# Patient Record
Sex: Male | Born: 1960 | Race: White | Hispanic: No | Marital: Married | Smoking: Never smoker
Health system: Southern US, Community
[De-identification: ages and names within clinical notes are randomized; demographics above are authoritative.]

## PROBLEM LIST (undated history)

## (undated) DIAGNOSIS — C439 Malignant melanoma of skin, unspecified: Secondary | ICD-10-CM

## (undated) HISTORY — DX: Malignant melanoma of skin, unspecified: C43.9

## (undated) HISTORY — PX: ACHILLES TENDON REPAIR: SUR1153

## (undated) HISTORY — PX: SMALL INTESTINE SURGERY: SHX150

---

## 2012-02-23 ENCOUNTER — Emergency Department
Admission: EM | Admit: 2012-02-23 | Discharge: 2012-02-23 | Disposition: A | Discharge status: Home / Self Care | Payer: PRIVATE HEALTH INSURANCE | Source: Home / Self Care | Attending: Family Medicine | Admitting: Family Medicine

## 2012-02-23 DIAGNOSIS — J029 Acute pharyngitis, unspecified: Secondary | ICD-10-CM

## 2012-02-23 DIAGNOSIS — J209 Acute bronchitis, unspecified: Secondary | ICD-10-CM

## 2012-02-23 LAB — POCT RAPID STREP A (OFFICE): Rapid Strep A Screen: NEGATIVE

## 2012-02-23 MED ORDER — GUAIFENESIN-CODEINE 100-10 MG/5ML PO SYRP: 10.0000 mL | ORAL_SOLUTION | Freq: Every day | ORAL | Status: AC

## 2012-02-23 MED ORDER — CLARITHROMYCIN 500 MG PO TABS: 500.0000 mg | ORAL_TABLET | Freq: Two times a day (BID) | ORAL | Status: AC

## 2012-02-23 NOTE — ED Provider Notes (Signed)
History     CSN: [id number]  Arrival date & time 02/23/12  1936   First MD Initiated Contact with Patient 02/23/12 2046      Chief Complaint  Patient presents with  . Sore Throat    1 day  . Cough    2 days      HPI Comments: Patient complains of 2 day history of mild sore throat and a non-productive cough.  He has had no nasal congestion.  Complains of fatigue and initial myalgias.  Cough is now worse at night and generally non-productive during the day.  There has been no pleuritic pain, shortness of breath, or wheezes.  He had fever yesterday.  He does not recall his last tetanus shot  The history is provided by the patient.    History reviewed. No pertinent past medical history.  Past Surgical History  Procedure Date  . Achilles tendon repair     History reviewed. No pertinent family history.  History  Substance Use Topics  . Smoking status: Never Smoker   . Smokeless tobacco: Never Used  . Alcohol Use: 0.6 oz/week    1 Cans of beer per week      Review of Systems + sore throat + cough No pleuritic pain No wheezing No nasal congestion No post-nasal drainage No sinus pain/pressure No itchy/red eyes No earache No hemoptysis No SOB + fever, + chills No nausea No vomiting No abdominal pain No diarrhea No urinary symptoms No skin rashes + fatigue + myalgias No headache Used OTC meds without relief  Allergies  Review of patient's allergies indicates no known allergies.  Home Medications   Current Outpatient Rx  Name Route Sig Dispense Refill  . CLARITHROMYCIN 500 MG PO TABS Oral Take 1 tablet (500 mg total) by mouth 2 (two) times daily. 14 tablet 0  . GUAIFENESIN-CODEINE 100-10 MG/5ML PO SYRP Oral Take 10 mLs by mouth at bedtime. for cough 120 mL 0    BP 124/84  Pulse 101  Temp(Src) 99.6 F (37.6 C) (Oral)  Resp 18  Ht 5\' 10"  (1.778 m)  Wt 198 lb (89.812 kg)  BMI 28.41 kg/m2  SpO2 94%  Physical Exam Nursing notes and Vital Signs  reviewed. Appearance:  Patient appears healthy, stated age, and in no acute distress.  Patient is overweight (BMI 28.5) Eyes:  Pupils are equal, round, and reactive to light and accomodation.  Extraocular movement is intact.  Conjunctivae are not inflamed  Ears:  Canals normal.  Tympanic membranes normal.  Nose:  Mildly congested turbinates.  No sinus tenderness.     Pharynx:  Normal Neck:  Supple.  Slightly tender shotty posterior nodes are palpated bilaterally  Lungs:  Clear to auscultation.  Breath sounds are equal.  Heart:  Regular rate and rhythm without murmurs, rubs, or gallops.  Abdomen:  Nontender without masses or hepatosplenomegaly.  Bowel sounds are present.  No CVA or flank tenderness.  Extremities:  No edema.  No calf tenderness Skin:  No rash present.   ED Course  Procedures  none   Labs Reviewed  POCT RAPID STREP A (OFFICE) - Normal      1. Acute bronchitis       MDM  ? Pertussis Begin Biaxin.  Rx for Cherratussin at bedtime. Take plain Mucinex (guaifenesin) twice daily for cough and congestion.  May add Sudafed if sinus congestion increases.  Increase fluid intake, rest. Stop all antihistamines for now, and other non-prescription cough/cold preparations. Recommend a Tdap when well.  Follow-up with family doctor if not improving 7 to 10 days.         Lattie Haw, MD 02/25/12 1149

## 2012-02-23 NOTE — ED Notes (Addendum)
Patient complains of dry cough and sore throat for 24-48 hours. Two weeks ago he had fever and joint pain and saw his primary care doctor and was treated with tamiflu for 5 days. He and his wife went on a cruise to the [place] a week ago. Denies chills or sweats within the last week.

## 2012-02-23 NOTE — Discharge Instructions (Signed)
Take plain Mucinex (guaifenesin) twice daily for cough and congestion.  May add Sudafed if sinus congestion increases.  Increase fluid intake, rest. Stop all antihistamines for now, and other non-prescription cough/cold preparations. Recommend a Tdap when well.  Follow-up with family doctor if not improving 7 to 10 days.

## 2014-12-25 ENCOUNTER — Emergency Department
Admission: EM | Admit: 2014-12-25 | Discharge: 2014-12-25 | Disposition: A | Discharge status: Home / Self Care | Payer: Commercial Managed Care - PPO | Source: Home / Self Care | Attending: Emergency Medicine | Admitting: Emergency Medicine

## 2014-12-25 ENCOUNTER — Encounter: Payer: Self-pay | Admitting: *Deleted

## 2014-12-25 ENCOUNTER — Emergency Department (INDEPENDENT_AMBULATORY_CARE_PROVIDER_SITE_OTHER): Payer: Commercial Managed Care - PPO

## 2014-12-25 DIAGNOSIS — M2550 Pain in unspecified joint: Secondary | ICD-10-CM

## 2014-12-25 DIAGNOSIS — R05 Cough: Secondary | ICD-10-CM | POA: Diagnosis not present

## 2014-12-25 DIAGNOSIS — J189 Pneumonia, unspecified organism: Secondary | ICD-10-CM

## 2014-12-25 DIAGNOSIS — R059 Cough, unspecified: Secondary | ICD-10-CM

## 2014-12-25 DIAGNOSIS — R509 Fever, unspecified: Secondary | ICD-10-CM | POA: Diagnosis not present

## 2014-12-25 DIAGNOSIS — R918 Other nonspecific abnormal finding of lung field: Secondary | ICD-10-CM | POA: Diagnosis not present

## 2014-12-25 LAB — POCT INFLUENZA A/B
Influenza A, POC: NEGATIVE
Influenza B, POC: NEGATIVE

## 2014-12-25 LAB — POCT CBC W AUTO DIFF (K'VILLE URGENT CARE)

## 2014-12-25 MED ORDER — ONDANSETRON 4 MG PO TBDP: 4.0000 mg | ORAL_TABLET | Freq: Three times a day (TID) | ORAL | Status: DC | PRN

## 2014-12-25 MED ORDER — ACETAMINOPHEN 325 MG PO TABS
650.0000 mg | ORAL_TABLET | Freq: Once | ORAL | Status: AC
  Administered 2014-12-25: 650 mg via ORAL

## 2014-12-25 MED ORDER — ONDANSETRON 4 MG PO TBDP
4.0000 mg | ORAL_TABLET | ORAL | Status: AC
  Administered 2014-12-25: 4 mg via ORAL

## 2014-12-25 MED ORDER — CEFTRIAXONE SODIUM 1 G IJ SOLR
1.0000 g | INTRAMUSCULAR | Status: AC
  Administered 2014-12-25: 1 g via INTRAMUSCULAR

## 2014-12-25 MED ORDER — DOXYCYCLINE HYCLATE 100 MG PO CAPS: 100.0000 mg | ORAL_CAPSULE | Freq: Two times a day (BID) | ORAL | Status: DC

## 2014-12-25 MED ORDER — OSELTAMIVIR PHOSPHATE 75 MG PO CAPS: ORAL_CAPSULE | ORAL | Status: DC

## 2014-12-25 NOTE — ED Notes (Signed)
Flay c/o body aches, dizziness, nausea and cough and congestion x yesterday AM. Tylenol taken @ 11am today. Rec'd flu vac this season.

## 2014-12-25 NOTE — ED Provider Notes (Signed)
CSN: [id number]     Arrival date & time 12/25/14  1244 History   First MD Initiated Contact with Patient 12/25/14 1305     Chief Complaint  Patient presents with  . Generalized Body Aches   HPI Tommy Strickland c/o body aches, dizziness, nausea and cough and congestion x yesterday AM. Tylenol taken @ 11am today. Rec'd flu vac this season. Fever to 102, chills, sweats, myalgias and diffuse arthralgias. Moderate nonfocal headache. Complains of mild congestion in sinuses. No discolored rhinorrhea. Has hoarseness. Progressively worse cough, occasionally productive of discolored sputum. Vague feeling of shortness of breath with possible wheezing at times, but he is not certain if he is wheezing. Severe fatigue. Mild nausea without vomiting. No other GI symptoms. He has decreased appetite but is tolerating by mouth liquids and solids. No eye symptoms. No conjunctivitis symptoms. Denies urinary symptoms. No rash or history of tick or insect bite. He was in [place] at a convention last week and felt well then. He flew back to [place] 2 days ago and felt well until yesterday, acute onset of fever or chills and myalgias and arthralgias. Some others of his coworkers from [place] had febrile illnesses. He denies recent foreign travel. Tylenol temporarily helps his headache and fever and myalgias. Denies syncope or focal neurologic symptoms. Nonsmoker. History reviewed. No pertinent past medical history. Past Surgical History  Procedure Laterality Date  . Achilles tendon repair     History reviewed. No pertinent family history. History  Substance Use Topics  . Smoking status: Never Smoker   . Smokeless tobacco: Never Used  . Alcohol Use: 0.6 oz/week    1 Cans of beer per week    Review of Systems  All other systems reviewed and are negative.   Allergies  Review of patient's allergies indicates no known allergies.  Home Medications   Prior to Admission medications   Medication Sig  Start Date End Date Taking? Authorizing Provider  doxycycline (VIBRAMYCIN) 100 MG capsule Take 1 capsule (100 mg total) by mouth 2 (two) times daily. 12/25/14   Jacqulyn Cane, MD  ondansetron (ZOFRAN-ODT) 4 MG disintegrating tablet Take 1 tablet (4 mg total) by mouth every 8 (eight) hours as needed for nausea. 12/25/14   Jacqulyn Cane, MD  oseltamivir (TAMIFLU) 75 MG capsule Starting today, take 1 capsule by mouth twice a day for 5 days. 12/25/14   Jacqulyn Cane, MD   BP 134/83 mmHg  Pulse 110  Temp(Src) 99.7 F (37.6 C) (Oral)  Resp 18  Wt 284 lb (128.822 kg)  SpO2 95% Physical Exam  Constitutional: He appears well-developed and well-nourished. He appears ill (very fatigued, but no cardiorespiratory distress). No distress.  Appears mildly toxic. He is alert, cooperative. Mentation and speech normal  HENT:  Head: Normocephalic and atraumatic.  Right Ear: Tympanic membrane and external ear normal.  Left Ear: Tympanic membrane and external ear normal.  Nose: Mucosal edema (Mild) and rhinorrhea (Minimal) present. Right sinus exhibits no maxillary sinus tenderness and no frontal sinus tenderness. Left sinus exhibits no maxillary sinus tenderness and no frontal sinus tenderness.  Mouth/Throat: Mucous membranes are normal. Posterior oropharyngeal erythema (mild redness ) present. No oropharyngeal exudate.  Eyes: Conjunctivae and EOM are normal. Pupils are equal, round, and reactive to light. Right eye exhibits no discharge and no exudate. Left eye exhibits no discharge and no exudate. Right conjunctiva is not injected. Left conjunctiva is not injected. No scleral icterus.  Neck: Normal range of motion. Neck supple. No JVD present.  No rigidity. Normal range of motion present. No Brudzinski's sign and no Kernig's sign noted.  Cardiovascular: Normal rate, regular rhythm and normal heart sounds.   Pulmonary/Chest: No stridor. No respiratory distress. He has no wheezes. He has rales (mild bibasilar).   Abdominal: Soft. There is no tenderness.  Musculoskeletal: He exhibits no edema.  Lymphadenopathy:    He has cervical adenopathy (mild shoddy anterior cervical nodes).  Neurological: He is alert. He is not disoriented. No cranial nerve deficit or sensory deficit. Coordination normal.  Gait is normal, although it's difficult for him to walk without assistance because of generalized fatigue  Skin: Skin is warm and intact. No rash noted. He is diaphoretic.  Psychiatric: He has a normal mood and affect.  Nursing note and vitals reviewed.   ED Course  Procedures (including critical care time) Labs Review Labs Reviewed  B. BURGDORFI ANTIBODIES  ROCKY MTN SPOTTED FVR ABS PNL(IGG+IGM)  POCT INFLUENZA A/B  POCT CBC W AUTO DIFF ([place])   Results for orders placed or performed during the hospital encounter of 12/25/14  POCT Influenza A/B  Result Value Ref Range   Influenza A, POC Negative    Influenza B, POC Negative    CBC: WBC 6.0, 79% granulocytes.   Hemoglobin 14.3, platelets normal 153,000   Imaging Review Dg Chest 2 View  12/25/2014   CLINICAL DATA:  54 year old male with fever dizziness body ache headache and nausea. Febrile illness. Initial encounter.  EXAM: CHEST  2 VIEW  COMPARISON:  None.  FINDINGS: Low lung volumes with platelike bibasilar opacity. Cardiac size at the upper limits of normal to mildly enlarged. Other mediastinal contours are within normal limits. Visualized tracheal air column is within normal limits. No pneumothorax or pulmonary edema. No pleural effusion. No acute osseous abnormality identified.  IMPRESSION: Low lung volumes with platelike bibasilar opacity that most resembles atelectasis.   Electronically Signed   By: Genevie Ann M.D.   On: 12/25/2014 14:18     MDM   1. CAP (community acquired pneumonia)   2. Cough   3. Febrile illness, acute   4. Acute joint pain    Clinically, I feel that he can be treated as an outpatient but precautions  discussed with patient and wife. We'll treat with doxycycline to cover community-acquired pneumonia as well as atypical causes, and doxycycline would cover RMSF or Lyme's disease.--Send off our MSF and Lyme's disease tests. They declined other testing at this time. Clinically, Zika is lower in the differential, as he does not have conjunctivitis and his arthralgias and myalgias are diffuse, not just in hands and feet. Rapid flu test was negative, but I'm concerned that he might have influenza as a primary or secondary diagnosis. They declined any reference lab viral testing at this time. Treatment options discussed, as well as risks, benefits, alternatives. They voiced understanding and agreement with the following plans: Rocephin 1 g IM stat Discharge Medication List as of 12/25/2014  2:46 PM    START taking these medications   Details  doxycycline (VIBRAMYCIN) 100 MG capsule Take 1 capsule (100 mg total) by mouth 2 (two) times daily., Starting 12/25/2014, Until Discontinued, Print    ondansetron (ZOFRAN-ODT) 4 MG disintegrating tablet Take 1 tablet (4 mg total) by mouth every 8 (eight) hours as needed for nausea., Starting 12/25/2014, Until Discontinued, Print    oseltamivir (TAMIFLU) 75 MG capsule Starting today, take 1 capsule by mouth twice a day for 5 days., Print  push clear liquids and advance to bland diet as tolerated. Follow-up with your primary care doctor in 1-2 days , or sooner if symptoms become worse. Precautions discussed. Red flags discussed.---ER if any red flag. Questions invited and answered. Patient and wife voiced understanding and agreement.       Jacqulyn Cane, MD 12/25/14 [phone]

## 2014-12-25 NOTE — Discharge Instructions (Signed)
Pneumonia Pneumonia is an infection of the lungs.  CAUSES Pneumonia may be caused by bacteria or a virus. Usually, these infections are caused by breathing infectious particles into the lungs (respiratory tract). SIGNS AND SYMPTOMS   Cough.  Fever.  Chest pain.  Increased rate of breathing.  Wheezing.  Mucus production. DIAGNOSIS  If you have the common symptoms of pneumonia, your health care provider will typically confirm the diagnosis with a chest X-ray. The X-ray will show an abnormality in the lung (pulmonary infiltrate) if you have pneumonia. Other tests of your blood, urine, or sputum may be done to find the specific cause of your pneumonia. Your health care provider may also do tests (blood gases or pulse oximetry) to see how well your lungs are working. TREATMENT  Some forms of pneumonia may be spread to other people when you cough or sneeze. You may be asked to wear a mask before and during your exam. Pneumonia that is caused by bacteria is treated with antibiotic medicine. Pneumonia that is caused by the influenza virus may be treated with an antiviral medicine. Most other viral infections must run their course. These infections will not respond to antibiotics.  HOME CARE INSTRUCTIONS   Cough suppressants may be used if you are losing too much rest. However, coughing protects you by clearing your lungs. You should avoid using cough suppressants if you can.  Your health care provider may have prescribed medicine if he or she thinks your pneumonia is caused by bacteria or influenza. Finish your medicine even if you start to feel better.  Your health care provider may also prescribe an expectorant. This loosens the mucus to be coughed up.  Take medicines only as directed by your health care provider.  Do not smoke. Smoking is a common cause of bronchitis and can contribute to pneumonia. If you are a smoker and continue to smoke, your cough may last several weeks after your  pneumonia has cleared.  A cold steam vaporizer or humidifier in your room or home may help loosen mucus.  Coughing is often worse at night. Sleeping in a semi-upright position in a recliner or using a couple pillows under your head will help with this.  Get rest as you feel it is needed. Your body will usually let you know when you need to rest. PREVENTION A pneumococcal shot (vaccine) is available to prevent a common bacterial cause of pneumonia. This is usually suggested for:  People over 65 years old.  Patients on chemotherapy.  People with chronic lung problems, such as bronchitis or emphysema.  People with immune system problems. If you are over 65 or have a high risk condition, you may receive the pneumococcal vaccine if you have not received it before. In some countries, a routine influenza vaccine is also recommended. This vaccine can help prevent some cases of pneumonia.You may be offered the influenza vaccine as part of your care. If you smoke, it is time to quit. You may receive instructions on how to stop smoking. Your health care provider can provide medicines and counseling to help you quit. SEEK MEDICAL CARE IF: You have a fever. SEEK IMMEDIATE MEDICAL CARE IF:   Your illness becomes worse. This is especially true if you are elderly or weakened from any other disease.  You cannot control your cough with suppressants and are losing sleep.  You begin coughing up blood.  You develop pain which is getting worse or is uncontrolled with medicines.  Any of the symptoms   which initially brought you in for treatment are getting worse rather than better.  You develop shortness of breath or chest pain. MAKE SURE YOU:   Understand these instructions.  Will watch your condition.  Will get help right away if you are not doing well or get worse. Document Released: 11/02/2005 Document Revised: 03/19/2014 Document Reviewed: 01/22/2011 [facility] Patient Information 2015  [place], [place]. This information is not intended to replace advice given to you by your health care provider. Make sure you discuss any questions you have with your health care provider.   Influenza Influenza ("the flu") is a viral infection of the respiratory tract. It occurs more often in winter months because people spend more time in close contact with one another. Influenza can make you feel very sick. Influenza easily spreads from person to person (contagious). CAUSES  Influenza is caused by a virus that infects the respiratory tract. You can catch the virus by breathing in droplets from an infected person's cough or sneeze. You can also catch the virus by touching something that was recently contaminated with the virus and then touching your mouth, nose, or eyes. RISKS AND COMPLICATIONS You may be at risk for a more severe case of influenza if you smoke cigarettes, have diabetes, have chronic heart disease (such as heart failure) or lung disease (such as asthma), or if you have a weakened immune system. Elderly people and pregnant women are also at risk for more serious infections. The most common problem of influenza is a lung infection (pneumonia). Sometimes, this problem can require emergency medical care and may be life threatening. SIGNS AND SYMPTOMS  Symptoms typically last 4 to 10 days and may include:  Fever.  Chills.  Headache, body aches, and muscle aches.  Sore throat.  Chest discomfort and cough.  Poor appetite.  Weakness or feeling tired.  Dizziness.  Nausea or vomiting. DIAGNOSIS  Diagnosis of influenza is often made based on your history and a physical exam. A nose or throat swab test can be done to confirm the diagnosis. TREATMENT  In mild cases, influenza goes away on its own. Treatment is directed at relieving symptoms. For more severe cases, your health care provider may prescribe antiviral medicines to shorten the sickness. Antibiotic medicines are not  effective because the infection is caused by a virus, not by bacteria. HOME CARE INSTRUCTIONS  Take medicines only as directed by your health care provider.  Use a cool mist humidifier to make breathing easier.  Get plenty of rest until your temperature returns to normal. This usually takes 3 to 4 days.  Drink enough fluid to keep your urine clear or pale yellow.  Cover yourmouth and nosewhen coughing or sneezing,and wash your handswellto prevent thevirusfrom spreading.  Stay homefromwork orschool untilthe fever is gonefor at least 21full day. PREVENTION  An annual influenza vaccination (flu shot) is the best way to avoid getting influenza. An annual flu shot is now routinely recommended for all adults in the [place] IF:  You experiencechest pain, yourcough worsens,or you producemore mucus.  Youhave nausea,vomiting, ordiarrhea.  Your fever returns or gets worse. SEEK IMMEDIATE MEDICAL CARE IF:  You havetrouble breathing, you become short of breath,or your skin ornails becomebluish.  You have severe painor stiffnessin the neck.  You develop a sudden headache, or pain in the face or ear.  You have nausea or vomiting that you cannot control. MAKE SURE YOU:   Understand these instructions.  Will watch your condition.  Will get  help right away if you are not doing well or get worse. Document Released: 10/30/2000 Document Revised: 03/19/2014 Document Reviewed: 02/01/2012 [facility] Patient Information 2015 [place], [place]. This information is not intended to replace advice given to you by your health care provider. Make sure you discuss any questions you have with your health care provider.   The doxycycline antibiotic prescribed will cover most arterial germs that cause pneumonia. Doxycycline also would cover Lyme's disease or [facility] spotted fever (those tests are pending) Also, we are prescribing Tamiflu for influenza-like  symptoms.  For pain or fever, Tylenol alternating with ibuprofen every 4 hours

## 2014-12-26 ENCOUNTER — Telehealth: Payer: Self-pay | Admitting: *Deleted

## 2014-12-27 LAB — B. BURGDORFI ANTIBODIES: B burgdorferi Ab IgG+IgM: 0.23 {ISR}

## 2014-12-28 LAB — ROCKY MTN SPOTTED FVR ABS PNL(IGG+IGM)
RMSF IgG: 1.98 IV — ABNORMAL HIGH
RMSF IgM: 0.12 IV

## 2015-10-22 ENCOUNTER — Encounter: Payer: Self-pay | Admitting: *Deleted

## 2015-10-22 ENCOUNTER — Emergency Department
Admission: EM | Admit: 2015-10-22 | Discharge: 2015-10-22 | Disposition: A | Discharge status: Home / Self Care | Payer: Commercial Managed Care - PPO | Source: Home / Self Care | Attending: Emergency Medicine | Admitting: Emergency Medicine

## 2015-10-22 DIAGNOSIS — S39012A Strain of muscle, fascia and tendon of lower back, initial encounter: Secondary | ICD-10-CM | POA: Diagnosis not present

## 2015-10-22 DIAGNOSIS — M5441 Lumbago with sciatica, right side: Secondary | ICD-10-CM

## 2015-10-22 MED ORDER — PREDNISONE 10 MG (21) PO TBPK: ORAL_TABLET | ORAL | Status: DC

## 2015-10-22 MED ORDER — HYDROCODONE-ACETAMINOPHEN 5-325 MG PO TABS: 1.0000 | ORAL_TABLET | ORAL | Status: DC | PRN

## 2015-10-22 NOTE — Discharge Instructions (Signed)
Low Back Strain With Rehab A strain is an injury in which a tendon or muscle is torn. The muscles and tendons of the lower back are vulnerable to strains. However, these muscles and tendons are very strong and require a great force to be injured. Strains are classified into three categories. Grade 1 strains cause pain, but the tendon is not lengthened. Grade 2 strains include a lengthened ligament, due to the ligament being stretched or partially ruptured. With grade 2 strains there is still function, although the function may be decreased. Grade 3 strains involve a complete tear of the tendon or muscle, and function is usually impaired. SYMPTOMS   Pain in the lower back.  Pain that affects one side more than the other.  Pain that gets worse with movement and may be felt in the hip, buttocks, or back of the thigh.  Muscle spasms of the muscles in the back.  Swelling along the muscles of the back.  Loss of strength of the back muscles.  Crackling sound (crepitation) when the muscles are touched. CAUSES  Lower back strains occur when a force is placed on the muscles or tendons that is greater than they can handle. Common causes of injury include:  Prolonged overuse of the muscle-tendon units in the lower back, usually from incorrect posture.  A single violent injury or force applied to the back. RISK INCREASES WITH:  Sports that involve twisting forces on the spine or a lot of bending at the waist (football, rugby, weightlifting, bowling, golf, tennis, speed skating, racquetball, swimming, running, gymnastics, diving).  Poor strength and flexibility.  Failure to warm up properly before activity.  Family history of lower back pain or disk disorders.  Previous back injury or surgery (especially fusion).  Poor posture with lifting, especially heavy objects.  Prolonged sitting, especially with poor posture. PREVENTION   Learn and use proper posture when sitting or lifting (maintain  proper posture when sitting, lift using the knees and legs, not at the waist).  Warm up and stretch properly before activity.  Allow for adequate recovery between workouts.  Maintain physical fitness:  Strength, flexibility, and endurance.  Cardiovascular fitness. PROGNOSIS  If treated properly, lower back strains usually heal within 6 weeks. RELATED COMPLICATIONS   Recurring symptoms, resulting in a chronic problem.  Chronic inflammation, scarring, and partial muscle-tendon tear.  Delayed healing or resolution of symptoms.  Prolonged disability. TREATMENT  Start prescription for prednisone, 6 day Dosepak, to reduce pain and inflammation. Prescription for hydrocodone if needed for severe pain. Continue cyclobenzaprine every 8 hours as needed for muscle relaxant. Also, continue Aleve, take 2 every 8 hours with food for pain. (This is nondrowsy). Follow-up with your doctor or orthopedist if no better in one week, sooner if worse or any numbness or weakness in leg. Treatment first involves the use of ice and medicine, to reduce pain and inflammation. The use of strengthening and stretching exercises may help reduce pain with activity. These exercises may be performed at home or with a therapist. Severe injuries may require referral to a therapist for further evaluation and treatment, such as ultrasound. Your caregiver may advise that you wear a back brace or corset, to help reduce pain and discomfort. Often, prolonged bed rest results in greater harm then benefit. Corticosteroid injections may be recommended. However, these should be reserved for the most serious cases. It is important to avoid using your back when lifting objects. At night, sleep on your back on a firm mattress with  a pillow placed under your knees. If non-surgical treatment is unsuccessful, surgery may be needed.  MEDICATION   If pain medicine is needed, nonsteroidal anti-inflammatory medicines (aspirin and ibuprofen),  or other minor pain relievers (acetaminophen), are often advised.  Do not take pain medicine for 7 days before surgery.  Prescription pain relievers may be given, if your caregiver thinks they are needed. Use only as directed and only as much as you need.  Ointments applied to the skin may be helpful.  Corticosteroid injections may be given by your caregiver. These injections should be reserved for the most serious cases, because they may only be given a certain number of times. HEAT AND COLD  Cold treatment (icing) should be applied for 10 to 15 minutes every 2 to 3 hours for inflammation and pain, and immediately after activity that aggravates your symptoms. Use ice packs or an ice massage.  Heat treatment may be used before performing stretching and strengthening activities prescribed by your caregiver, physical therapist, or athletic trainer. Use a heat pack or a warm water soak. SEEK MEDICAL CARE IF:   Symptoms get worse or do not improve in 2 to 4 weeks, despite treatment.  You develop numbness, weakness, or loss of bowel or bladder function.  New, unexplained symptoms develop. (Drugs used in treatment may produce side effects.) EXERCISES  RANGE OF MOTION (ROM) AND STRETCHING EXERCISES - Low Back Strain Most people with lower back pain will find that their symptoms get worse with excessive bending forward (flexion) or arching at the lower back (extension). The exercises which will help resolve your symptoms will focus on the opposite motion.  Your physician, physical therapist or athletic trainer will help you determine which exercises will be most helpful to resolve your lower back pain. Do not complete any exercises without first consulting with your caregiver. Discontinue any exercises which make your symptoms worse until you speak to your caregiver.  If you have pain, numbness or tingling which travels down into your buttocks, leg or foot, the goal of the therapy is for these  symptoms to move closer to your back and eventually resolve. Sometimes, these leg symptoms will get better, but your lower back pain may worsen. This is typically an indication of progress in your rehabilitation. Be very alert to any changes in your symptoms and the activities in which you participated in the 24 hours prior to the change. Sharing this information with your caregiver will allow him/her to most efficiently treat your condition.  These exercises may help you when beginning to rehabilitate your injury. Your symptoms may resolve with or without further involvement from your physician, physical therapist or athletic trainer. While completing these exercises, remember:  Restoring tissue flexibility helps normal motion to return to the joints. This allows healthier, less painful movement and activity.  An effective stretch should be held for at least 30 seconds.  A stretch should never be painful. You should only feel a gentle lengthening or release in the stretched tissue. FLEXION RANGE OF MOTION AND STRETCHING EXERCISES: STRETCH - Flexion, Single Knee to Chest   Lie on a firm bed or floor with both legs extended in front of you.  Keeping one leg in contact with the floor, bring your opposite knee to your chest. Hold your leg in place by either grabbing behind your thigh or at your knee.  Pull until you feel a gentle stretch in your lower back. Hold __________ seconds.  Slowly release your grasp and repeat the  exercise with the opposite side. Repeat __________ times. Complete this exercise __________ times per day.  STRETCH - Flexion, Double Knee to Chest   Lie on a firm bed or floor with both legs extended in front of you.  Keeping one leg in contact with the floor, bring your opposite knee to your chest.  Tense your stomach muscles to support your back and then lift your other knee to your chest. Hold your legs in place by either grabbing behind your thighs or at your  knees.  Pull both knees toward your chest until you feel a gentle stretch in your lower back. Hold __________ seconds.  Tense your stomach muscles and slowly return one leg at a time to the floor. Repeat __________ times. Complete this exercise __________ times per day.  STRETCH - Low Trunk Rotation  Lie on a firm bed or floor. Keeping your legs in front of you, bend your knees so they are both pointed toward the ceiling and your feet are flat on the floor.  Extend your arms out to the side. This will stabilize your upper body by keeping your shoulders in contact with the floor.  Gently and slowly drop both knees together to one side until you feel a gentle stretch in your lower back. Hold for __________ seconds.  Tense your stomach muscles to support your lower back as you bring your knees back to the starting position. Repeat the exercise to the other side. Repeat __________ times. Complete this exercise __________ times per day  EXTENSION RANGE OF MOTION AND FLEXIBILITY EXERCISES: STRETCH - Extension, Prone on Elbows   Lie on your stomach on the floor, a bed will be too soft. Place your palms about shoulder width apart and at the height of your head.  Place your elbows under your shoulders. If this is too painful, stack pillows under your chest.  Allow your body to relax so that your hips drop lower and make contact more completely with the floor.  Hold this position for __________ seconds.  Slowly return to lying flat on the floor. Repeat __________ times. Complete this exercise __________ times per day.  RANGE OF MOTION - Extension, Prone Press Ups  Lie on your stomach on the floor, a bed will be too soft. Place your palms about shoulder width apart and at the height of your head.  Keeping your back as relaxed as possible, slowly straighten your elbows while keeping your hips on the floor. You may adjust the placement of your hands to maximize your comfort. As you gain motion,  your hands will come more underneath your shoulders.  Hold this position __________ seconds.  Slowly return to lying flat on the floor. Repeat __________ times. Complete this exercise __________ times per day.  RANGE OF MOTION- Quadruped, Neutral Spine   Assume a hands and knees position on a firm surface. Keep your hands under your shoulders and your knees under your hips. You may place padding under your knees for comfort.  Drop your head and point your tail bone toward the ground below you. This will round out your lower back like an angry cat. Hold this position for __________ seconds.  Slowly lift your head and release your tail bone so that your back sags into a large arch, like an old horse.  Hold this position for __________ seconds.  Repeat this until you feel limber in your lower back.  Now, find your "sweet spot." This will be the most comfortable position somewhere between  the two previous positions. This is your neutral spine. Once you have found this position, tense your stomach muscles to support your lower back.  Hold this position for __________ seconds. Repeat __________ times. Complete this exercise __________ times per day.  STRENGTHENING EXERCISES - Low Back Strain These exercises may help you when beginning to rehabilitate your injury. These exercises should be done near your "sweet spot." This is the neutral, low-back arch, somewhere between fully rounded and fully arched, that is your least painful position. When performed in this safe range of motion, these exercises can be used for people who have either a flexion or extension based injury. These exercises may resolve your symptoms with or without further involvement from your physician, physical therapist or athletic trainer. While completing these exercises, remember:   Muscles can gain both the endurance and the strength needed for everyday activities through controlled exercises.  Complete these exercises as  instructed by your physician, physical therapist or athletic trainer. Increase the resistance and repetitions only as guided.  You may experience muscle soreness or fatigue, but the pain or discomfort you are trying to eliminate should never worsen during these exercises. If this pain does worsen, stop and make certain you are following the directions exactly. If the pain is still present after adjustments, discontinue the exercise until you can discuss the trouble with your caregiver. STRENGTHENING - Deep Abdominals, Pelvic Tilt  Lie on a firm bed or floor. Keeping your legs in front of you, bend your knees so they are both pointed toward the ceiling and your feet are flat on the floor.  Tense your lower abdominal muscles to press your lower back into the floor. This motion will rotate your pelvis so that your tail bone is scooping upwards rather than pointing at your feet or into the floor.  With a gentle tension and even breathing, hold this position for __________ seconds. Repeat __________ times. Complete this exercise __________ times per day.  STRENGTHENING - Abdominals, Crunches   Lie on a firm bed or floor. Keeping your legs in front of you, bend your knees so they are both pointed toward the ceiling and your feet are flat on the floor. Cross your arms over your chest.  Slightly tip your chin down without bending your neck.  Tense your abdominals and slowly lift your trunk high enough to just clear your shoulder blades. Lifting higher can put excessive stress on the lower back and does not further strengthen your abdominal muscles.  Control your return to the starting position. Repeat __________ times. Complete this exercise __________ times per day.  STRENGTHENING - Quadruped, Opposite UE/LE Lift   Assume a hands and knees position on a firm surface. Keep your hands under your shoulders and your knees under your hips. You may place padding under your knees for comfort.  Find your  neutral spine and gently tense your abdominal muscles so that you can maintain this position. Your shoulders and hips should form a rectangle that is parallel with the floor and is not twisted.  Keeping your trunk steady, lift your right hand no higher than your shoulder and then your left leg no higher than your hip. Make sure you are not holding your breath. Hold this position __________ seconds.  Continuing to keep your abdominal muscles tense and your back steady, slowly return to your starting position. Repeat with the opposite arm and leg. Repeat __________ times. Complete this exercise __________ times per day.  STRENGTHENING - Lower Abdominals,  Double Knee Lift  Lie on a firm bed or floor. Keeping your legs in front of you, bend your knees so they are both pointed toward the ceiling and your feet are flat on the floor.  Tense your abdominal muscles to brace your lower back and slowly lift both of your knees until they come over your hips. Be certain not to hold your breath.  Hold __________ seconds. Using your abdominal muscles, return to the starting position in a slow and controlled manner. Repeat __________ times. Complete this exercise __________ times per day.  POSTURE AND BODY MECHANICS CONSIDERATIONS - Low Back Strain Keeping correct posture when sitting, standing or completing your activities will reduce the stress put on different body tissues, allowing injured tissues a chance to heal and limiting painful experiences. The following are general guidelines for improved posture. Your physician or physical therapist will provide you with any instructions specific to your needs. While reading these guidelines, remember:  The exercises prescribed by your provider will help you have the flexibility and strength to maintain correct postures.  The correct posture provides the best environment for your joints to work. All of your joints have less wear and tear when properly supported by a  spine with good posture. This means you will experience a healthier, less painful body.  Correct posture must be practiced with all of your activities, especially prolonged sitting and standing. Correct posture is as important when doing repetitive low-stress activities (typing) as it is when doing a single heavy-load activity (lifting). RESTING POSITIONS Consider which positions are most painful for you when choosing a resting position. If you have pain with flexion-based activities (sitting, bending, stooping, squatting), choose a position that allows you to rest in a less flexed posture. You would want to avoid curling into a fetal position on your side. If your pain worsens with extension-based activities (prolonged standing, working overhead), avoid resting in an extended position such as sleeping on your stomach. Most people will find more comfort when they rest with their spine in a more neutral position, neither too rounded nor too arched. Lying on a non-sagging bed on your side with a pillow between your knees, or on your back with a pillow under your knees will often provide some relief. Keep in mind, being in any one position for a prolonged period of time, no matter how correct your posture, can still lead to stiffness. PROPER SITTING POSTURE In order to minimize stress and discomfort on your spine, you must sit with correct posture. Sitting with good posture should be effortless for a healthy body. Returning to good posture is a gradual process. Many people can work toward this most comfortably by using various supports until they have the flexibility and strength to maintain this posture on their own. When sitting with proper posture, your ears will fall over your shoulders and your shoulders will fall over your hips. You should use the back of the chair to support your upper back. Your lower back will be in a neutral position, just slightly arched. You may place a small pillow or folded towel  at the base of your lower back for support.  When working at a desk, create an environment that supports good, upright posture. Without extra support, muscles tire, which leads to excessive strain on joints and other tissues. Keep these recommendations in mind: CHAIR:  A chair should be able to slide under your desk when your back makes contact with the back of the chair. This  allows you to work closely.  The chair's height should allow your eyes to be level with the upper part of your monitor and your hands to be slightly lower than your elbows. BODY POSITION  Your feet should make contact with the floor. If this is not possible, use a foot rest.  Keep your ears over your shoulders. This will reduce stress on your neck and lower back. INCORRECT SITTING POSTURES  If you are feeling tired and unable to assume a healthy sitting posture, do not slouch or slump. This puts excessive strain on your back tissues, causing more damage and pain. Healthier options include:  Using more support, like a lumbar pillow.  Switching tasks to something that requires you to be upright or walking.  Talking a brief walk.  Lying down to rest in a neutral-spine position. PROLONGED STANDING WHILE SLIGHTLY LEANING FORWARD  When completing a task that requires you to lean forward while standing in one place for a long time, place either foot up on a stationary 2-4 inch high object to help maintain the best posture. When both feet are on the ground, the lower back tends to lose its slight inward curve. If this curve flattens (or becomes too large), then the back and your other joints will experience too much stress, tire more quickly, and can cause pain. CORRECT STANDING POSTURES Proper standing posture should be assumed with all daily activities, even if they only take a few moments, like when brushing your teeth. As in sitting, your ears should fall over your shoulders and your shoulders should fall over your hips.  You should keep a slight tension in your abdominal muscles to brace your spine. Your tailbone should point down to the ground, not behind your body, resulting in an over-extended swayback posture.  INCORRECT STANDING POSTURES  Common incorrect standing postures include a forward head, locked knees and/or an excessive swayback. WALKING Walk with an upright posture. Your ears, shoulders and hips should all line-up. PROLONGED ACTIVITY IN A FLEXED POSITION When completing a task that requires you to bend forward at your waist or lean over a low surface, try to find a way to stabilize 3 out of 4 of your limbs. You can place a hand or elbow on your thigh or rest a knee on the surface you are reaching across. This will provide you more stability so that your muscles do not fatigue as quickly. By keeping your knees relaxed, or slightly bent, you will also reduce stress across your lower back. CORRECT LIFTING TECHNIQUES DO :   Assume a wide stance. This will provide you more stability and the opportunity to get as close as possible to the object which you are lifting.  Tense your abdominals to brace your spine. Bend at the knees and hips. Keeping your back locked in a neutral-spine position, lift using your leg muscles. Lift with your legs, keeping your back straight.  Test the weight of unknown objects before attempting to lift them.  Try to keep your elbows locked down at your sides in order get the best strength from your shoulders when carrying an object.  Always ask for help when lifting heavy or awkward objects. INCORRECT LIFTING TECHNIQUES DO NOT:   Lock your knees when lifting, even if it is a small object.  Bend and twist. Pivot at your feet or move your feet when needing to change directions.  Assume that you can safely pick up even a paper clip without proper posture.  Sciatica Sciatica is pain, weakness, numbness, or tingling along your sciatic nerve. The nerve starts in the  lower back and runs down the back of each leg. Nerve damage or certain conditions pinch or put pressure on the sciatic nerve. This causes the pain, weakness, and other discomforts of sciatica. HOME CARE   Only take medicine as told by your doctor.  Apply ice to the affected area for 20 minutes. Do this 3-4 times a day for the first 48-72 hours. Then try heat in the same way.  Exercise, stretch, or do your usual activities if these do not make your pain worse.  Go to physical therapy as told by your doctor.  Keep all doctor visits as told.  Do not wear high heels or shoes that are not supportive.  Get a firm mattress if your mattress is too soft to lessen pain and discomfort. GET HELP RIGHT AWAY IF:   You cannot control when you poop (bowel movement) or pee (urinate).  You have more weakness in your lower back, lower belly (pelvis), butt (buttocks), or legs.  You have redness or puffiness (swelling) of your back.  You have a burning feeling when you pee.  You have pain that gets worse when you lie down.  You have pain that wakes you from your sleep.  Your pain is worse than past pain.  Your pain lasts longer than 4 weeks.  You are suddenly losing weight without reason. MAKE SURE YOU:   Understand these instructions.  Will watch this condition.  Will get help right away if you are not doing well or get worse.   This information is not intended to replace advice given to you by your health care provider. Make sure you discuss any questions you have with your health care provider.   Document Released: 08/11/2008 Document Revised: 07/24/2015 Document Reviewed: 03/13/2012 [employer] Interactive Patient Education [employer].

## 2015-10-22 NOTE — ED Notes (Signed)
Pt c/o RT side low back pain x 3 days after stretching to get the Christmas tree off the top of his car. He is wearing back brace from home and has taken Cyclobenzaprine with some relief. He took Cyclobenzaprine at 0600 and 1130 today and Aleve at 0700.

## 2015-10-22 NOTE — ED Provider Notes (Signed)
CSN: [id number]     Arrival date & time 10/22/15  1638 History   First MD Initiated Contact with Patient 10/22/15 1645     Chief Complaint  Patient presents with  . Back Pain    HPI Pt c/o moderate-severe sharp R sided low back pain. Acute onset 3 days ago, after stretching to get the Christmas tree off the top of his car. He is wearing back brace from home, which has helped somewhat. His PCP called in Cyclobenzaprine, with some relief. He took Cyclobenzaprine at 0600 and 1130 today . He took one Aleve at 0700, and that only minimally helped the pain. The midline lumbar pain spreads to the right paralumbar area and deep into the right buttock but does not go down the leg. Denies weakness, numbness or bowel or bladder dysfunction. He denies chronic back problems, but states that he gets a low back strain about once a year that resolves without sequelae. He has never had imaging of his low back.  History reviewed. No pertinent past medical history. Past Surgical History  Procedure Laterality Date  . Achilles tendon repair     History reviewed. No pertinent family history. Social History  Substance Use Topics  . Smoking status: Never Smoker   . Smokeless tobacco: Never Used  . Alcohol Use: 0.6 oz/week    1 Cans of beer per week    Review of Systems  All other systems reviewed and are negative.   Allergies  Review of patient's allergies indicates no known allergies.  Home Medications   Prior to Admission medications   Medication Sig Start Date End Date Taking? Authorizing Provider  cyclobenzaprine (FLEXERIL) 10 MG tablet Take 10 mg by mouth 3 (three) times daily as needed for muscle spasms.   Yes Historical Provider, MD  HYDROcodone-acetaminophen (NORCO/VICODIN) 5-325 MG tablet Take 1-2 tablets by mouth every 4 (four) hours as needed for severe pain. Take with food. Caution: May cause drowsiness 10/22/15   Jacqulyn Cane, MD  predniSONE (STERAPRED UNI-PAK 21 TAB) 10 MG (21) TBPK  tablet Take as directed for 6 days.--Take 6 on day 1, 5 on day 2, 4 on day 3, then 3 tablets on day 4, then 2 tablets on day 5, then 1 on day 6. 10/22/15   Jacqulyn Cane, MD   Meds Ordered and Administered this Visit  Medications - No data to display  BP 130/79 mmHg  Pulse 84  Temp(Src) 97.8 F (36.6 C) (Oral)  Resp 18  Ht 6' (1.829 m)  Wt 290 lb (131.543 kg)  BMI 39.32 kg/m2  SpO2 96% No data found.   Physical Exam  Constitutional: He is oriented to person, place, and time. He appears well-developed and well-nourished. He is cooperative.  Non-toxic appearance. He appears distressed (Appears uncomfortable from low back pain,he splints himselfto move, get up from sitting to standing, with antalgic gait.).  HENT:  Head: Normocephalic and atraumatic.  Mouth/Throat: Oropharynx is clear and moist.  Eyes: EOM are normal. Pupils are equal, round, and reactive to light. No scleral icterus.  Neck: Neck supple.  Cardiovascular: Regular rhythm and normal heart sounds.   Pulmonary/Chest: Effort normal and breath sounds normal. No respiratory distress. He has no wheezes. He has no rales. He exhibits no tenderness.  Abdominal: Soft. There is no tenderness.  Musculoskeletal:       Right hip: Normal.       Left hip: Normal.       Cervical back: He exhibits no tenderness.  Thoracic back: He exhibits no tenderness.       Lumbar back: He exhibits decreased range of motion, tenderness and spasm (Right paralumbar area). He exhibits no bony tenderness, no swelling, no edema, no deformity, no laceration and normal pulse.  POSITIVE Right straight leg-raise test. Negative Left straight leg-raise test.  Negative Right Saralyn Pilar test. Negative Left Saralyn Pilar test.    Neurological: He is alert and oriented to person, place, and time. He has normal strength. He displays no atrophy, no tremor and normal reflexes. No cranial nerve deficit or sensory deficit. He exhibits normal muscle tone. Gait normal.   Reflex Scores:      Patellar reflexes are 2+ on the right side and 2+ on the left side.      Achilles reflexes are 2+ on the right side and 2+ on the left side. Skin: Skin is warm, dry and intact. No lesion and no rash noted.  Psychiatric: He has a normal mood and affect.  Nursing note and vitals reviewed.   ED Course  Procedures (including critical care time)  Labs Review Labs Reviewed - No data to display  Imaging Review No results found.   MDM   1. Lumbar strain, initial encounter   2. Acute back pain with sciatica, right    Options discussed, as well as risks, benefits, alternatives. I suggested and advised lumbar x-rays, but he declined at this time. Patient voiced understanding and agreement with the following plans: Rest, then gradually increase activity. May continue with back brace that he has, but wean off this over the next few days. Discharge Medication List as of 10/22/2015  5:39 PM    START taking these medications   Details  HYDROcodone-acetaminophen (NORCO/VICODIN) 5-325 MG tablet Take 1-2 tablets by mouth every 4 (four) hours as needed for severe pain. Take with food. Caution: May cause drowsiness, Starting 10/22/2015, Until Discontinued, Print    predniSONE (STERAPRED UNI-PAK 21 TAB) 10 MG (21) TBPK tablet Take as directed for 6 days.--Take 6 on day 1, 5 on day 2, 4 on day 3, then 3 tablets on day 4, then 2 tablets on day 5, then 1 on day 6., Print       Continue cyclobenzaprine for muscle relaxant. May take 2 Aleve every 8 hours as needed for mild-to-moderate pain. See detailed Instructions in AVS, which were given to parent. Verbal instructions also given.   Questions invited and answered. Precautions discussed. Red flags discussed.--Emergency room if any red flag. Follow-up with PCP or orthopedist if no better one week.  He voiced understanding and agreement with plans.    Jacqulyn Cane, MD 10/22/15 [phone]

## 2015-11-20 LAB — BASIC METABOLIC PANEL
BUN: 25 — AB (ref 4–21)
Creatinine: 0.9 (ref 0.6–1.3)
GLUCOSE: 90
Potassium: 4.4 (ref 3.4–5.3)
Sodium: 140 (ref 137–147)

## 2015-11-20 LAB — CBC AND DIFFERENTIAL
HEMATOCRIT: 47 (ref 41–53)
HEMOGLOBIN: 15.4 (ref 13.5–17.5)
Platelets: 8 — AB (ref 150–399)
WBC: 5.4

## 2015-11-20 LAB — LIPID PANEL
CHOLESTEROL: 217 — AB (ref 0–200)
HDL: 49 (ref 35–70)
LDL CALC: 129
Triglycerides: 194 — AB (ref 40–160)

## 2015-11-20 LAB — TSH: TSH: 1.37 (ref 0.41–5.90)

## 2015-11-20 LAB — HEPATIC FUNCTION PANEL
ALT: 29 (ref 10–40)
AST: 24 (ref 14–40)
Alkaline Phosphatase: 53 (ref 25–125)
Bilirubin, Total: 0.9

## 2018-01-03 ENCOUNTER — Encounter: Payer: Self-pay | Admitting: Sports Medicine

## 2018-01-03 ENCOUNTER — Ambulatory Visit: Payer: Commercial Managed Care - PPO | Admitting: Sports Medicine

## 2018-01-03 ENCOUNTER — Ambulatory Visit (INDEPENDENT_AMBULATORY_CARE_PROVIDER_SITE_OTHER): Payer: Commercial Managed Care - PPO

## 2018-01-03 DIAGNOSIS — M51369 Other intervertebral disc degeneration, lumbar region without mention of lumbar back pain or lower extremity pain: Secondary | ICD-10-CM | POA: Insufficient documentation

## 2018-01-03 DIAGNOSIS — M5136 Other intervertebral disc degeneration, lumbar region: Secondary | ICD-10-CM | POA: Diagnosis not present

## 2018-01-03 DIAGNOSIS — M4807 Spinal stenosis, lumbosacral region: Secondary | ICD-10-CM

## 2018-01-03 DIAGNOSIS — R635 Abnormal weight gain: Secondary | ICD-10-CM

## 2018-01-03 MED ORDER — PREDNISONE 50 MG PO TABS: ORAL_TABLET | ORAL | 0 refills | Status: DC

## 2018-01-03 MED ORDER — MELOXICAM 15 MG PO TABS: ORAL_TABLET | ORAL | 3 refills | Status: DC

## 2018-01-03 MED ORDER — PHENTERMINE HCL 37.5 MG PO TABS
ORAL_TABLET | ORAL | 0 refills | Status: DC
Start: 2018-01-03 — End: 2018-01-31

## 2018-01-03 NOTE — Assessment & Plan Note (Signed)
Starting phentermine, exercise prescription given. He will follow-up with Iran Planas, PA-C for further weight loss treatment.

## 2018-01-03 NOTE — Progress Notes (Signed)
Subjective:    I'm seeing this patient as a consultation for: Dr. Starling Manns  CC: Low back pain  HPI: Tommy Strickland is a pleasant 57 year old male, he has had long-standing low back pain.  Sitting, flexion, Valsalva make it worse, radiation to the right buttock but not past the knee, no bowel or bladder dysfunction, saddle numbness, constitutional symptoms.  Obesity: Has worked hard with diet, exercise, has trouble losing weight.  Agreeable to try weight loss medication.  I reviewed the past medical history, family history, social history, surgical history, and allergies today and no changes were needed.  Please see the problem list section below in epic for further details.  Past Medical History: Past Medical History:  Diagnosis Date  . Skin cancer (melanoma) [facility])    Past Surgical History: Past Surgical History:  Procedure Laterality Date  . ACHILLES TENDON REPAIR    . SMALL INTESTINE SURGERY     Social History: Social History   Socioeconomic History  . Marital status: Married    Spouse name: None  . Number of children: None  . Years of education: None  . Highest education level: None  Social Needs  . Financial resource strain: None  . Food insecurity - worry: None  . Food insecurity - inability: None  . Transportation needs - medical: None  . Transportation needs - non-medical: None  Occupational History  . None  Tobacco Use  . Smoking status: Never Smoker  . Smokeless tobacco: Never Used  Substance and Sexual Activity  . Alcohol use: Yes    Alcohol/week: 0.6 oz    Types: 1 Cans of beer per week  . Drug use: No  . Sexual activity: None  Other Topics Concern  . None  Social History Narrative  . None   Family History: No family history on file. Allergies: No Known Allergies Medications: See med rec.  Review of Systems: No headache, visual changes, nausea, vomiting, diarrhea, constipation, dizziness, abdominal pain, skin rash, fevers, chills, night  sweats, weight loss, swollen lymph nodes, body aches, joint swelling, muscle aches, chest pain, shortness of breath, mood changes, visual or auditory hallucinations.   Objective:   General: Well Developed, well nourished, and in no acute distress.  Neuro:  Extra-ocular muscles intact, able to move all 4 extremities, sensation grossly intact.  Deep tendon reflexes tested were normal. Psych: Alert and oriented, mood congruent with affect. ENT:  Ears and nose appear unremarkable.  Hearing grossly normal. Neck: Unremarkable overall appearance, trachea midline.  No visible thyroid enlargement. Eyes: Conjunctivae and lids appear unremarkable.  Pupils equal and round. Skin: Warm and dry, no rashes noted.  Cardiovascular: Pulses palpable, no extremity edema. Back Exam:  Inspection: Unremarkable  Motion: Flexion 45 deg, Extension 45 deg, Side Bending to 45 deg bilaterally,  Rotation to 45 deg bilaterally  SLR laying: Negative  XSLR laying: Negative  Palpable tenderness: None. FABER: negative. Sensory change: Gross sensation intact to all lumbar and sacral dermatomes.  Reflexes: 2+ at both patellar tendons, 2+ at achilles tendons, Babinski's downgoing.  Strength at foot  Plantar-flexion: 5/5 Dorsi-flexion: 5/5 Eversion: 5/5 Inversion: 5/5  Leg strength  Quad: 5/5 Hamstring: 5/5 Hip flexor: 5/5 Hip abductors: 5/5  Gait unremarkable.  X-rays personally reviewed and show moderate to severe degenerative disc disease from L3-S1, with the worst disc space narrowing at L5-S1.  Impression and Recommendations:   This case required medical decision making of moderate complexity.  Abnormal weight gain Starting phentermine, exercise prescription given. He will follow-up  with Iran Planas, PA-C for further weight loss treatment.  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 X-rays, formal PT, prednisone, Mobic. Return to see me in 4  weeks. ___________________________________________ Gwen Her. Dianah Field, M.D., ABFM., CAQSM. Primary Care and [place] Instructor of [place] [employer] [facility] of Medicine

## 2018-01-03 NOTE — Assessment & Plan Note (Signed)
Discogenic axial back pain on the right. X-rays, formal PT, prednisone, Mobic. Return to see me in 4 weeks.

## 2018-01-11 LAB — PSA: PSA: 1.1

## 2018-01-11 LAB — HEPATIC FUNCTION PANEL
ALT: 24 (ref 10–40)
AST: 17 (ref 14–40)
Alkaline Phosphatase: 59 (ref 25–125)
Bilirubin, Total: 0.7

## 2018-01-11 LAB — CBC AND DIFFERENTIAL
HEMATOCRIT: 46 (ref 41–53)
Hemoglobin: 15.7 (ref 13.5–17.5)
Neutrophils Absolute: 3
Platelets: 206 (ref 150–399)
WBC: 6.4

## 2018-01-11 LAB — LIPID PANEL
Cholesterol: 165 (ref 0–200)
HDL: 50 (ref 35–70)
LDL CALC: 77
Triglycerides: 190 — AB (ref 40–160)

## 2018-01-11 LAB — BASIC METABOLIC PANEL
BUN: 19 (ref 4–21)
Creatinine: 1.1 (ref 0.6–1.3)
GLUCOSE: 106
POTASSIUM: 4.2 (ref 3.4–5.3)
Sodium: 145 (ref 137–147)

## 2018-01-11 LAB — HEMOGLOBIN A1C: Hemoglobin A1C: 5.7

## 2018-01-11 LAB — TSH: TSH: 2.35 (ref 0.41–5.90)

## 2018-01-13 ENCOUNTER — Ambulatory Visit: Payer: Commercial Managed Care - PPO | Admitting: Rehabilitative and Restorative Service Providers"

## 2018-01-18 ENCOUNTER — Ambulatory Visit: Payer: Commercial Managed Care - PPO | Admitting: Physician Assistant

## 2018-01-18 ENCOUNTER — Encounter: Payer: Self-pay | Admitting: Physician Assistant

## 2018-01-18 VITALS — BP 122/64 | HR 84 | Ht 72.0 in | Wt 280.0 lb

## 2018-01-18 DIAGNOSIS — E6609 Other obesity due to excess calories: Secondary | ICD-10-CM

## 2018-01-18 DIAGNOSIS — C4491 Basal cell carcinoma of skin, unspecified: Secondary | ICD-10-CM | POA: Diagnosis not present

## 2018-01-18 DIAGNOSIS — Z6838 Body mass index (BMI) 38.0-38.9, adult: Secondary | ICD-10-CM

## 2018-01-18 DIAGNOSIS — R7303 Prediabetes: Secondary | ICD-10-CM | POA: Diagnosis not present

## 2018-01-18 DIAGNOSIS — L570 Actinic keratosis: Secondary | ICD-10-CM | POA: Diagnosis not present

## 2018-01-18 NOTE — Patient Instructions (Signed)
Actinic Keratosis An actinic keratosis is a precancerous growth on the skin. This means that it could develop into skin cancer if it is not treated. About 1% of these growths (actinic keratoses) turn into skin cancer within one year if they are not treated. It is important to have all of these growths evaluated to determine the best treatment approach. What are the causes? This condition is caused by getting too much ultraviolet (UV) radiation from the sun or other UV light sources. What increases the risk? The following factors may make you more likely to develop this condition:  Having light-colored skin and blue eyes.  Having blonde or red hair.  Spending a lot of time in the sun.  Inadequate skin protection when outdoors. This may include: ? Not using sunscreen properly. ? Not covering up skin that is exposed to sunlight.  Aging. The risk of developing an actinic keratosis increases with age.  What are the signs or symptoms? Actinic keratoses look like scaly, rough spots of skin.They can be as small as a pinhead or as big as a quarter. They may itch, hurt, or feel sensitive. In most cases, the growths become red. In some cases, they may be skin-colored, light tan, dark tan, pink, or a combination of any of these colors. There may be a small piece of pink or gray skin (skin tag) growing from the actinic keratosis. In some cases, it may be easier to notice actinic keratoses by feeling them, rather than seeing them. Actinic keratoses appear most often on areas of skin that get a lot of sun exposure, including the scalp, face, ears, lips, upper back, forearms, and the backs of the hands. Sometimes, actinic keratoses disappear, but many reappear a few days to a few weeks later. How is this diagnosed? This condition is usually diagnosed with a physical exam. A tissue sample may be removed from the actinic keratosis and examined under a microscope (biopsy). How is this treated?  Treatment for  this condition may include:  Scraping off the actinic keratosis (curettage).  Freezing the actinic keratosis with liquid nitrogen (cryosurgery). This causes the growth to eventually fall off the skin.  Applying medicated creams or gels to destroy the cells in the growth.  Applying chemicals to the actinic keratosis to make the outer layers of skin peel off (chemical peel).  Photodynamic therapy. In this procedure, medicated cream is applied to the actinic keratosis. This cream increases your skin's sensitivity to light. Then, a strong light is aimed at the actinic keratosis to destroy cells in the growth.  Follow these instructions at home: Skin care  Apply cool, wet cloths (cool compresses) to the affected areas.  Do not scratch your skin.  Check your skin regularly for any growths, especially growths that: ? Start to itch or bleed. ? Change in size, shape, or color. Caring for the treated area  Keep the treated area clean and dry as told by your health care provider.  Do not apply any medicine, cream, or lotion to the treated area unless your health care provider tells you to do that.  Do not pick at blisters or try to break them open. This can cause infection and scarring.  If you have red or irritated skin after treatment, follow instructions from your health care provider about how to take care of the treated area. Make sure you: ? Wash your hands with soap and water before you change your bandage (dressing). If soap and water are not available, use  hand sanitizer. ? Change your dressing as told by your health care provider.  If you have red or irritated skin after treatment, check your treated area every day for signs of infection. Check for: ? Swelling, pain, or more redness. ? Fluid or blood. ? Warmth. ? Pus or a bad smell. General instructions  Take over-the-counter and prescription medicines only as told by your health care provider.  Return to your normal  activities as told by your health care provider. Ask your health care provider what activities are safe for you.  Do not use any tobacco products, such as cigarettes, chewing tobacco, and e-cigarettes. If you need help quitting, ask your health care provider.  Have a skin exam done every year by a health care provider who is a skin conditions specialist (dermatologist).  Keep all follow-up visits as told by your health care provider. This is important. How is this prevented?  Do not get sunburns.  Try to avoid the sun between 10:00 a.m. and 4:00 p.m. This is when the UV light is the strongest.  Use a sunscreen or sunblock with SPF 30 (sun protection factor 30) or greater.  Apply sunscreen before you are exposed to sunlight, and reapply periodically as often as directed by the instructions on the sunscreen container.  Always wear sunglasses that have UV protection, and always wear hats and clothing to protect your skin from sunlight.  When possible, avoid medicines that increase your sensitivity to sunlight. These include: ? Certain antibiotic medicines. ? Certain water pills (diuretics). ? Certain prescription medicines that are used to treat acne (retinoids).  Do not use tanning beds or other indoor tanning devices. Contact a health care provider if:  You notice any changes or new growths on your skin.  You have swelling, pain, or more redness around your treated area.  You have fluid or blood coming from your treated area.  Your treated area feels warm to the touch.  You have pus or a bad smell coming from your treated area.  You have a fever.  You have a blister that becomes large and painful. This information is not intended to replace advice given to you by your health care provider. Make sure you discuss any questions you have with your health care provider. Document Released: 01/29/2009 Document Revised: 07/03/2016 Document Reviewed: 07/13/2015 Elsevier Interactive  Patient Education  2018 Elsevier Inc.  

## 2018-01-18 NOTE — Progress Notes (Addendum)
Subjective:    Patient ID: Tommy Strickland, male    DOB: 11/13/1961, 57 y.o.   MRN: [id number]  HPI Patient is a 57 year old obese male who presents to the clinic to establish care.  He would like me to look at scaly lesion on left temple. Hx of BCC.   .. Active Ambulatory Problems    Diagnosis Date Noted  . Lumbar degenerative disc disease 01/03/2018  . Abnormal weight gain 01/03/2018  . Skin cancer, basal cell 01/18/2018  . Pre-diabetes 01/18/2018   Resolved Ambulatory Problems    Diagnosis Date Noted  . No Resolved Ambulatory Problems   No Additional Past Medical History    Social History   Socioeconomic History  . Marital status: Married    Spouse name: Izora Gala  . Number of children: 1  . Years of education: Not on file  . Highest education level: Not on file  Social Needs  . Financial resource strain: Not on file  . Food insecurity - worry: Not on file  . Food insecurity - inability: Not on file  . Transportation needs - medical: Not on file  . Transportation needs - non-medical: Not on file  Occupational History  . Not on file  Tobacco Use  . Smoking status: Never Smoker  . Smokeless tobacco: Never Used  Substance and Sexual Activity  . Alcohol use: No    Alcohol/week: 0.6 oz    Types: 1 Cans of beer per week    Frequency: Never  . Drug use: No  . Sexual activity: Not on file  Other Topics Concern  . Not on file  Social History Narrative  . Not on file   .Marland Kitchen Family History  Problem Relation Age of Onset  . Stroke Paternal Grandfather     .Marland Kitchen Past Surgical History:  Procedure Laterality Date  . ACHILLES TENDON REPAIR    . SMALL INTESTINE SURGERY        Review of Systems  All other systems reviewed and are negative.      Objective:   Physical Exam  Constitutional: He is oriented to person, place, and time. He appears well-developed and well-nourished.  Obese.   HENT:  Head: Normocephalic and atraumatic.  Neck: Normal range of motion.  Neck supple.  Cardiovascular: Normal rate, regular rhythm and normal heart sounds.  Pulmonary/Chest: Effort normal and breath sounds normal.  Lymphadenopathy:    He has no cervical adenopathy.  Neurological: He is alert and oriented to person, place, and time.  Skin:  Erythematous macule with some fine scales on top. Approximately 1cm by .5cm on left temple.   Psychiatric: He has a normal mood and affect. His behavior is normal.          Assessment & Plan:  Marland KitchenMarland KitchenKree was seen today for establish care and prediabetes.  Diagnoses and all orders for this visit:  Class 2 obesity due to excess calories without serious comorbidity with body mass index (BMI) of 38.0 to 38.9 in adult  Skin cancer, basal cell  Actinic keratoses  Pre-diabetes   Cryotherapy Procedure Note  Pre-operative Diagnosis: Actinic keratosis  Post-operative Diagnosis: Actinic keratosis  Locations: left temple  Indications: precancerous  Procedure Details  History of allergy to iodine: no. Pacemaker? no.  Patient informed of risks (permanent scarring, infection, light or dark discoloration, bleeding, infection, weakness, numbness and recurrence of the lesion) and benefits of the procedure and verbal informed consent obtained.  The areas are treated with liquid nitrogen therapy, frozen until ice ball  extended 2 mm beyond lesion, allowed to thaw, and treated again. The patient tolerated procedure well.  The patient was instructed on post-op care, warned that there may be blister formation, redness and pain. Recommend OTC analgesia as needed for pain.  Condition: Stable  Complications: none.  Plan: 1. Instructed to keep the area dry and covered for 24-48h and clean thereafter. 2. Warning signs of infection were reviewed.   3. Recommended that the patient use OTC acetaminophen as needed for pain.    Pt had recent labs that look really good except for pre-diabetes. Weight loss should help with  this.continue on phentermine. Marland Kitchen.Discussed low carb diet with 1500 calories and 80g of protein.  Exercising at least 150 minutes a week.  [employer] could be a Microbiologist.    Will continue to monitor and recheck in 6 months.    Need records to up date health maintenance. Pt may call with colonoscopy clinic so we can get report.

## 2018-01-20 ENCOUNTER — Encounter: Payer: Self-pay | Admitting: Physician Assistant

## 2018-01-31 ENCOUNTER — Encounter: Payer: Self-pay | Admitting: Sports Medicine

## 2018-01-31 ENCOUNTER — Ambulatory Visit: Payer: Commercial Managed Care - PPO | Admitting: Sports Medicine

## 2018-01-31 DIAGNOSIS — R635 Abnormal weight gain: Secondary | ICD-10-CM | POA: Diagnosis not present

## 2018-01-31 DIAGNOSIS — M5136 Other intervertebral disc degeneration, lumbar region: Secondary | ICD-10-CM | POA: Diagnosis not present

## 2018-01-31 DIAGNOSIS — Z6838 Body mass index (BMI) 38.0-38.9, adult: Secondary | ICD-10-CM

## 2018-01-31 DIAGNOSIS — E6609 Other obesity due to excess calories: Secondary | ICD-10-CM

## 2018-01-31 DIAGNOSIS — M51369 Other intervertebral disc degeneration, lumbar region without mention of lumbar back pain or lower extremity pain: Secondary | ICD-10-CM

## 2018-01-31 MED ORDER — PHENTERMINE HCL 37.5 MG PO TABS: ORAL_TABLET | ORAL | 0 refills | Status: DC

## 2018-01-31 NOTE — Progress Notes (Signed)
  Subjective:    CC: Follow-up  HPI: Obesity: Mild weight loss with the first month on phentermine.  Low back pain: For the most part resolved with therapy, rehab exercises, meloxicam.  Continuing his exercises and losing weight will be the best bet for this.  I reviewed the past medical history, family history, social history, surgical history, and allergies today and no changes were needed.  Please see the problem list section below in epic for further details.  Past Medical History: No past medical history on file. Past Surgical History: Past Surgical History:  Procedure Laterality Date  . ACHILLES TENDON REPAIR    . SMALL INTESTINE SURGERY     Social History: Social History   Socioeconomic History  . Marital status: Married    Spouse name: Izora Gala  . Number of children: 1  . Years of education: None  . Highest education level: None  Social Needs  . Financial resource strain: None  . Food insecurity - worry: None  . Food insecurity - inability: None  . Transportation needs - medical: None  . Transportation needs - non-medical: None  Occupational History  . None  Tobacco Use  . Smoking status: Never Smoker  . Smokeless tobacco: Never Used  Substance and Sexual Activity  . Alcohol use: No    Alcohol/week: 0.6 oz    Types: 1 Cans of beer per week    Frequency: Never  . Drug use: No  . Sexual activity: None  Other Topics Concern  . None  Social History Narrative  . None   Family History: Family History  Problem Relation Age of Onset  . Stroke Paternal Grandfather    Allergies: No Known Allergies Medications: See med rec.  Review of Systems: No fevers, chills, night sweats, weight loss, chest pain, or shortness of breath.   Objective:    General: Well Developed, well nourished, and in no acute distress.  Neuro: Alert and oriented x3, extra-ocular muscles intact, sensation grossly intact.  HEENT: Normocephalic, atraumatic, pupils equal round reactive to  light, neck supple, no masses, no lymphadenopathy, thyroid nonpalpable.  Skin: Warm and dry, no rashes. Cardiac: Regular rate and rhythm, no murmurs rubs or gallops, no lower extremity edema.  Respiratory: Clear to auscultation bilaterally. Not using accessory muscles, speaking in full sentences.  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 Really had does not have any more pain. Continue aggressive weight loss, return to see me as needed.  Class 2 obesity due to excess calories without serious comorbidity with body mass index (BMI) of 38.0 to 38.9 in adult Happy to refill the phentermine, he can do his next week check with his PCP. Entering the second month. ___________________________________________ Gwen Her. Dianah Field, M.D., ABFM., CAQSM. Primary Care and [place] Instructor of [place] of [facility] of Medicine

## 2018-01-31 NOTE — Assessment & Plan Note (Signed)
Happy to refill the phentermine, he can do his next week check with his PCP. Entering the second month.

## 2018-01-31 NOTE — Assessment & Plan Note (Signed)
Discogenic axial back pain on the right, he has responded well to home rehab, Mobic. Really had does not have any more pain. Continue aggressive weight loss, return to see me as needed.

## 2018-02-28 ENCOUNTER — Ambulatory Visit: Payer: Commercial Managed Care - PPO | Admitting: Physician Assistant

## 2018-04-06 ENCOUNTER — Encounter: Payer: Self-pay | Admitting: Physician Assistant

## 2019-01-20 ENCOUNTER — Ambulatory Visit: Payer: Commercial Managed Care - PPO | Admitting: Physician Assistant

## 2019-01-23 ENCOUNTER — Encounter: Payer: Self-pay | Admitting: Physician Assistant

## 2019-01-23 ENCOUNTER — Ambulatory Visit: Payer: Commercial Managed Care - PPO | Admitting: Physician Assistant

## 2019-01-23 VITALS — BP 115/69 | HR 66 | Ht 72.0 in | Wt 260.0 lb

## 2019-01-23 DIAGNOSIS — K635 Polyp of colon: Secondary | ICD-10-CM | POA: Diagnosis not present

## 2019-01-23 DIAGNOSIS — Z1211 Encounter for screening for malignant neoplasm of colon: Secondary | ICD-10-CM

## 2019-01-23 DIAGNOSIS — Z0000 Encounter for general adult medical examination without abnormal findings: Secondary | ICD-10-CM

## 2019-01-23 DIAGNOSIS — Z23 Encounter for immunization: Secondary | ICD-10-CM | POA: Diagnosis not present

## 2019-01-23 NOTE — Progress Notes (Signed)
   Subjective:    Patient ID: Tommy Strickland, male    DOB: [birth year], 58 y.o.   MRN: [id number]  HPI  Pt is a 58 yo male who presents to the clinic to discuss need for colonoscopy.   2013 colonoscopy at [facility]. Hx of colon polyp. Not aware of when he was supposed to follow up.   He gets labs at work. Last drawn feb 2019.   .. Active Ambulatory Problems    Diagnosis Date Noted  . Lumbar degenerative disc disease 01/03/2018  . Abnormal weight gain 01/03/2018  . Skin cancer, basal cell 01/18/2018  . Pre-diabetes 01/18/2018  . Class 2 obesity due to excess calories without serious comorbidity with body mass index (BMI) of 38.0 to 38.9 in adult 01/18/2018  . Actinic keratoses 01/18/2018  . Polyp of colon 01/23/2019   Resolved Ambulatory Problems    Diagnosis Date Noted  . No Resolved Ambulatory Problems   No Additional Past Medical History      Review of Systems See HPI.     Objective:   Physical Exam Vitals signs reviewed.  Constitutional:      Appearance: Normal appearance.  HENT:     Head: Normocephalic and atraumatic.     Nose: Nose normal.  Cardiovascular:     Rate and Rhythm: Normal rate and regular rhythm.  Pulmonary:     Effort: Pulmonary effort is normal.     Breath sounds: Normal breath sounds.  Neurological:     General: No focal deficit present.     Mental Status: He is alert and oriented to person, place, and time.  Psychiatric:        Mood and Affect: Mood normal.        Behavior: Behavior normal.           Assessment & Plan:  Marland KitchenMarland KitchenDiagnoses and all orders for this visit:  Colon cancer screening -     Ambulatory referral to Gastroenterology  Need for shingles vaccine -     Varicella-zoster vaccine IM (Shingrix)  Polyp of colon, unspecified part of colon, unspecified type -     Ambulatory referral to Gastroenterology   Suspect patient needs follow up sooner than 10 years due to polyp. Made referral.   Over 50 discussed  shingrix. Pt agreed. 1st shot given today. Follow up in 2 months.   Will get labs at work. Asked to add hep C screening.

## 2019-01-23 NOTE — Patient Instructions (Addendum)
Need Hep C.   Recombinant Zoster (Shingles) Vaccine, RZV: What You Need to Know 1. Why get vaccinated? Shingles (also called herpes zoster, or just zoster) is a painful skin rash, often with blisters. Shingles is caused by the varicella zoster virus, the same virus that causes chickenpox. After you have chickenpox, the virus stays in your body and can cause shingles later in life. You can't catch shingles from another person. However, a person who has never had chickenpox (or chickenpox vaccine) could get chickenpox from someone with shingles. A shingles rash usually appears on one side of the face or body and heals within 2 to 4 weeks. Its main symptom is pain, which can be severe. Other symptoms can include fever, headache, chills, and upset stomach. Very rarely, a shingles infection can lead to pneumonia, hearing problems, blindness, brain inflammation (encephalitis), or death. For about 1 person in 5, severe pain can continue even long after the rash has cleared up. This long-lasting pain is called post-herpetic neuralgia (PHN). Shingles is far more common in people 66 years of age and older than in younger people, and the risk increases with age. It is also more common in people whose immune system is weakened because of a disease such as cancer, or by drugs such as steroids or chemotherapy. At least 1 million people a year in the [place] get shingles. 2. Shingles vaccine (recombinant) Recombinant shingles vaccine was approved by FDA in 2017 for the prevention of shingles. In clinical trials, it was more than 90% effective in preventing shingles. It can also reduce the likelihood of PHN. Two doses, 2 to 6 months apart, are recommended for adults 17 and older. This vaccine is also recommended for people who have already gotten the live shingles vaccine (Zostavax). There is no live virus in this vaccine. 3. Some people should not get this vaccine Tell your vaccine provider if you:  Have any  severe, life-threatening allergies. A person who has ever had a life-threatening allergic reaction after a dose of recombinant shingles vaccine, or has a severe allergy to any component of this vaccine, may be advised not to be vaccinated. Ask your health care provider if you want information about vaccine components.  Are pregnant or breastfeeding. There is not much information about use of recombinant shingles vaccine in pregnant or nursing women. Your healthcare provider might recommend delaying vaccination.  Are not feeling well. If you have a mild illness, such as a cold, you can probably get the vaccine today. If you are moderately or severely ill, you should probably wait until you recover. Your doctor can advise you. 4. Risks of a vaccine reaction With any medicine, including vaccines, there is a chance of reactions. After recombinant shingles vaccination, a person might experience:  Pain, redness, soreness, or swelling at the site of the injection  Headache, muscle aches, fever, shivering, fatigue In clinical trials, most people got a sore arm with mild or moderate pain after vaccination, and some also had redness and swelling where they got the shot. Some people felt tired, had muscle pain, a headache, shivering, fever, stomach pain, or nausea. About 1 out of 6 people who got recombinant zoster vaccine experienced side effects that prevented them from doing regular activities. Symptoms went away on their own in about 2 to 3 days. Side effects were more common in younger people. You should still get the second dose of recombinant zoster vaccine even if you had one of these reactions after the first dose.  Other things that could happen after this vaccine:  People sometimes faint after medical procedures, including vaccination. Sitting or lying down for about 15 minutes can help prevent fainting and injuries caused by a fall. Tell your provider if you feel dizzy or have vision changes or  ringing in the ears.  Some people get shoulder pain that can be more severe and longer-lasting than routine soreness that can follow injections. This happens very rarely.  Any medication can cause a severe allergic reaction. Such reactions to a vaccine are estimated at about 1 in a million doses, and would happen within a few minutes to a few hours after the vaccination. As with any medicine, there is a very remote chance of a vaccine causing a serious injury or death. The safety of vaccines is always being monitored. For more information, visit: http://www.aguilar.org/ 5. What if there is a serious problem? What should I look for?  Look for anything that concerns you, such as signs of a severe allergic reaction, very high fever, or unusual behavior. Signs of a severe allergic reaction can include hives, swelling of the face and throat, difficulty breathing, a fast heartbeat, dizziness, and weakness. These would usually start a few minutes to a few hours after the vaccination. What should I do?  If you think it is a severe allergic reaction or other emergency that can't wait, call 9-1-1 or get to the nearest hospital. Otherwise, call your health care provider. Afterward, the reaction should be reported to the Vaccine Adverse Event Reporting System (VAERS). Your doctor should file this report, or you can do it yourself through the VAERS website at www.vaers.SamedayNews.es, or by calling 253-742-8288. VAERS does not give medical advice. 6. How can I learn more?  Ask your health care provider. He or she can give you the vaccine package insert or suggest other sources of information.  Call your local or state health department.  Contact the Centers for Disease Control and Prevention (CDC): ? Call 267-181-0125 (1-800-CDC-INFO) or ? Visit CDC's vaccines website at http://hunter.com/ CDC Vaccine Information Statement Recombinant Zoster Vaccine (12/28/2016) This information is not intended to  replace advice given to you by your health care provider. Make sure you discuss any questions you have with your health care provider. Document Released: 01/12/2017 Document Revised: 06/08/2018 Document Reviewed: 06/08/2018 Elsevier Interactive Patient Education  2019 [employer].

## 2019-01-23 NOTE — Progress Notes (Signed)
l °

## 2019-04-24 ENCOUNTER — Encounter: Payer: Self-pay | Admitting: Physician Assistant

## 2019-04-24 LAB — HM COLONOSCOPY

## 2019-05-01 ENCOUNTER — Ambulatory Visit (INDEPENDENT_AMBULATORY_CARE_PROVIDER_SITE_OTHER): Payer: Commercial Managed Care - PPO | Admitting: Physician Assistant

## 2019-05-01 ENCOUNTER — Encounter: Payer: Self-pay | Admitting: Physician Assistant

## 2019-05-01 VITALS — BP 114/70 | HR 70 | Temp 97.5°F | Ht 72.0 in | Wt 260.0 lb

## 2019-05-01 DIAGNOSIS — Z23 Encounter for immunization: Secondary | ICD-10-CM

## 2019-05-01 DIAGNOSIS — R7303 Prediabetes: Secondary | ICD-10-CM

## 2019-05-01 DIAGNOSIS — E6609 Other obesity due to excess calories: Secondary | ICD-10-CM

## 2019-05-01 DIAGNOSIS — Z13220 Encounter for screening for lipoid disorders: Secondary | ICD-10-CM

## 2019-05-01 DIAGNOSIS — Z6835 Body mass index (BMI) 35.0-35.9, adult: Secondary | ICD-10-CM

## 2019-05-01 NOTE — Progress Notes (Signed)
Subjective:    Patient ID: Tommy Strickland, male    DOB: 1961/02/11, 58 y.o.   MRN: [id number]  HPI  Pt is a 59 yo obese male who presents to the clinic for 2nd shingles shot and follow up.   His first shingles vaccine hurt his arm for 5 days and wonders if this is normal. No problems now.   He is doing well. He has lost over 50lbs since July of last year. His starting weight was 314 and now 260. He is doing the keto and trying to be more active. He feels so much better.   His last labs were feb 2019. He was pre-diabetic.   .. Active Ambulatory Problems    Diagnosis Date Noted  . Lumbar degenerative disc disease 01/03/2018  . Abnormal weight gain 01/03/2018  . Skin cancer, basal cell 01/18/2018  . Pre-diabetes 01/18/2018  . Class 2 obesity due to excess calories without serious comorbidity with body mass index (BMI) of 38.0 to 38.9 in adult 01/18/2018  . Actinic keratoses 01/18/2018  . Polyp of colon 01/23/2019   Resolved Ambulatory Problems    Diagnosis Date Noted  . No Resolved Ambulatory Problems   No Additional Past Medical History     Review of Systems  All other systems reviewed and are negative.      Objective:   Physical Exam Vitals signs reviewed.  Constitutional:      Appearance: Normal appearance.  Cardiovascular:     Rate and Rhythm: Normal rate and regular rhythm.     Pulses: Normal pulses.     Heart sounds: Normal heart sounds.  Pulmonary:     Effort: Pulmonary effort is normal.     Breath sounds: Normal breath sounds.  Neurological:     General: No focal deficit present.     Mental Status: He is alert and oriented to person, place, and time.  Psychiatric:        Mood and Affect: Mood normal.        Behavior: Behavior normal.           Assessment & Plan:  Marland KitchenMarland KitchenAjit was seen today for follow-up.  Diagnoses and all orders for this visit:  Class 2 obesity due to excess calories without serious comorbidity with body mass index (BMI) of 35.0  to 35.9 in adult  Need for shingles vaccine -     Varicella-zoster vaccine IM (Shingrix)  Screening for lipid disorders -     Lipid Panel w/reflex Direct LDL  Prediabetes -     COMPLETE METABOLIC PANEL WITH GFR -     Hemoglobin A1c   .Marland Kitchen Depression screen [facility] 2/9 05/01/2019 01/18/2018  Decreased Interest 0 0  Down, Depressed, Hopeless 0 0  PHQ - 2 Score 0 0  Altered sleeping 0 -  Tired, decreased energy 0 -  Change in appetite 0 -  Feeling bad or failure about yourself  0 -  Trouble concentrating 0 -  Moving slowly or fidgety/restless 0 -  Suicidal thoughts 0 -  PHQ-9 Score 0 -  Difficult doing work/chores Not difficult at all -   Continues to lose weight. Keep up the good work. Goal BMI is under 30 for him.  Fasting labs ordered. He is going to check with work and see if they are doing free screenings this year.   Will call to get official colonoscopy report.  Last shingles vaccine given. Discussed IM injection and their can be some discomfort. Use warm compresses and NsAIDs  as needed.

## 2019-05-02 ENCOUNTER — Encounter: Payer: Self-pay | Admitting: Physician Assistant

## 2019-05-05 ENCOUNTER — Encounter: Payer: Self-pay | Admitting: Physician Assistant

## 2019-05-06 LAB — COMPLETE METABOLIC PANEL WITH GFR
AG Ratio: 1.6 (calc) (ref 1.0–2.5)
ALT: 19 U/L (ref 9–46)
AST: 18 U/L (ref 10–35)
Albumin: 4.5 g/dL (ref 3.6–5.1)
Alkaline phosphatase (APISO): 44 U/L (ref 35–144)
BUN: 16 mg/dL (ref 7–25)
CO2: 23 mmol/L (ref 20–32)
Calcium: 9.4 mg/dL (ref 8.6–10.3)
Chloride: 103 mmol/L (ref 98–110)
Creat: 1 mg/dL (ref 0.70–1.33)
GFR, Est African American: 96 mL/min/{1.73_m2} (ref >=60)
GFR, Est Non African American: 83 mL/min/{1.73_m2} (ref >=60)
Globulin: 2.8 g/dL (calc) (ref 1.9–3.7)
Glucose, Bld: 80 mg/dL (ref 65–99)
Potassium: 4.3 mmol/L (ref 3.5–5.3)
Sodium: 140 mmol/L (ref 135–146)
Total Bilirubin: 0.8 mg/dL (ref 0.2–1.2)
Total Protein: 7.3 g/dL (ref 6.1–8.1)

## 2019-05-06 LAB — HEMOGLOBIN A1C
Hgb A1c MFr Bld: 5.3 % of total Hgb (ref <5.7)
Mean Plasma Glucose: 105 (calc)
eAG (mmol/L): 5.8 (calc)

## 2019-05-06 LAB — LIPID PANEL W/REFLEX DIRECT LDL
Cholesterol: 194 mg/dL (ref <200)
HDL: 44 mg/dL (ref >=40)
LDL Cholesterol (Calc): 125 mg/dL (calc) — ABNORMAL HIGH
Non-HDL Cholesterol (Calc): 150 mg/dL (calc) — ABNORMAL HIGH (ref <130)
Total CHOL/HDL Ratio: 4.4 (calc) (ref <5.0)
Triglycerides: 141 mg/dL (ref <150)

## 2019-05-08 ENCOUNTER — Encounter: Payer: Self-pay | Admitting: Physician Assistant

## 2019-05-08 DIAGNOSIS — E785 Hyperlipidemia, unspecified: Secondary | ICD-10-CM | POA: Insufficient documentation

## 2019-05-08 NOTE — Progress Notes (Signed)
Call pt: A!C went down!!! [place] job. 5.3. kidney and liver look great!. TG went down. LDL is 125 but with no risk factors ok level. Would like to see it under 100. Will continue to monitor. 10 year CV risk is 6.3 percent. Under 7.5 ok to not treat with medication. Lab improving.

## 2019-05-25 ENCOUNTER — Encounter: Payer: Self-pay | Admitting: Physician Assistant

## 2019-06-14 ENCOUNTER — Encounter: Payer: Self-pay | Admitting: Physician Assistant

## 2019-06-14 ENCOUNTER — Ambulatory Visit (INDEPENDENT_AMBULATORY_CARE_PROVIDER_SITE_OTHER): Payer: Commercial Managed Care - PPO | Admitting: Physician Assistant

## 2019-06-14 ENCOUNTER — Other Ambulatory Visit: Payer: Self-pay

## 2019-06-14 VITALS — BP 121/66 | HR 62 | Temp 97.8°F | Ht 72.0 in | Wt 265.0 lb

## 2019-06-14 DIAGNOSIS — Z6835 Body mass index (BMI) 35.0-35.9, adult: Secondary | ICD-10-CM | POA: Diagnosis not present

## 2019-06-14 DIAGNOSIS — E6609 Other obesity due to excess calories: Secondary | ICD-10-CM | POA: Diagnosis not present

## 2019-06-14 DIAGNOSIS — E785 Hyperlipidemia, unspecified: Secondary | ICD-10-CM | POA: Diagnosis not present

## 2019-06-14 DIAGNOSIS — Z0000 Encounter for general adult medical examination without abnormal findings: Secondary | ICD-10-CM

## 2019-06-14 NOTE — Patient Instructions (Signed)

## 2019-06-14 NOTE — Progress Notes (Signed)
Subjective:    Patient ID: Tommy Strickland, male    DOB: 11-10-1961, 58 y.o.   MRN: [id number]  HPI Pt is a 58 yo obese male with hyperlipidemia who presents to the clinic for CPE.   No problems or concerns today.   .. Active Ambulatory Problems    Diagnosis Date Noted  . Lumbar degenerative disc disease 01/03/2018  . Abnormal weight gain 01/03/2018  . Skin cancer, basal cell 01/18/2018  . Class 2 obesity due to excess calories without serious comorbidity with body mass index (BMI) of 35.0 to 35.9 in adult 01/18/2018  . Actinic keratoses 01/18/2018  . Polyp of colon 01/23/2019  . Hyperlipidemia LDL goal <100 05/08/2019   Resolved Ambulatory Problems    Diagnosis Date Noted  . Pre-diabetes 01/18/2018   No Additional Past Medical History   .Marland Kitchen Family History  Problem Relation Age of Onset  . Stroke Paternal Grandfather    .Marland Kitchen Social History   Socioeconomic History  . Marital status: Married    Spouse name: Izora Gala  . Number of children: 1  . Years of education: Not on file  . Highest education level: Not on file  Occupational History  . Not on file  Social Needs  . Financial resource strain: Not on file  . Food insecurity    Worry: Not on file    Inability: Not on file  . Transportation needs    Medical: Not on file    Non-medical: Not on file  Tobacco Use  . Smoking status: Never Smoker  . Smokeless tobacco: Never Used  Substance and Sexual Activity  . Alcohol use: No    Alcohol/week: 1.0 standard drinks    Types: 1 Cans of beer per week    Frequency: Never  . Drug use: No  . Sexual activity: Not on file  Lifestyle  . Physical activity    Days per week: Not on file    Minutes per session: Not on file  . Stress: Not on file  Relationships  . Social Herbalist on phone: Not on file    Gets together: Not on file    Attends religious service: Not on file    Active member of club or organization: Not on file    Attends meetings of clubs or  organizations: Not on file    Relationship status: Not on file  . Intimate partner violence    Fear of current or ex partner: Not on file    Emotionally abused: Not on file    Physically abused: Not on file    Forced sexual activity: Not on file  Other Topics Concern  . Not on file  Social History Narrative  . Not on file      Review of Systems  All other systems reviewed and are negative.      Objective:   Physical Exam BP 121/66   Pulse 62   Temp 97.8 F (36.6 C) (Oral)   Ht 6' (1.829 m)   Wt 265 lb (120.2 kg)   SpO2 96%   BMI 35.94 kg/m   General Appearance:    Alert, cooperative, no distress, appears stated age  Head:    Normocephalic, without obvious abnormality, atraumatic  Eyes:    PERRL, conjunctiva/corneas clear, EOM's intact, fundi    benign, both eyes       Ears:    Normal TM's and external ear canals, both ears  Nose:   Nares normal, septum midline, mucosa  normal, no drainage    or sinus tenderness  Throat:   Lips, mucosa, and tongue normal; teeth and gums normal  Neck:   Supple, symmetrical, trachea midline, no adenopathy;       thyroid:  No enlargement/tenderness/nodules; no carotid   bruit or JVD  Back:     Symmetric, no curvature, ROM normal, no CVA tenderness  Lungs:     Clear to auscultation bilaterally, respirations unlabored  Chest wall:    No tenderness or deformity  Heart:    Regular rate and rhythm, S1 and S2 normal, no murmur, rub   or gallop  Abdomen:     Soft, non-tender, bowel sounds active all four quadrants,    no masses, no organomegaly        Extremities:   Extremities normal, atraumatic, no cyanosis or edema  Pulses:   2+ and symmetric all extremities  Skin:   Skin color, texture, turgor normal, no rashes or lesions  Lymph nodes:   Cervical, supraclavicular, and axillary nodes normal  Neurologic:   CNII-XII intact. Normal strength, sensation and reflexes      throughout        Assessment & Plan:  Marland KitchenMarland KitchenKayen was seen today for  annual exam.  Diagnoses and all orders for this visit:  Routine physical examination  Class 2 obesity due to excess calories without serious comorbidity with body mass index (BMI) of 35.0 to 35.9 in adult   .Marland Kitchen Depression screen [facility] 2/9 06/14/2019 05/01/2019 01/18/2018  Decreased Interest 0 0 0  Down, Depressed, Hopeless 0 0 0  PHQ - 2 Score 0 0 0  Altered sleeping 0 0 -  Tired, decreased energy 0 0 -  Change in appetite 0 0 -  Feeling bad or failure about yourself  0 0 -  Trouble concentrating 0 0 -  Moving slowly or fidgety/restless 0 0 -  Suicidal thoughts 0 0 -  PHQ-9 Score 0 0 -  Difficult doing work/chores Not difficult at all Not difficult at all -   .Marland KitchenIPSS Questionnaire (AUA-7): Over the past month.   1)  How often have you had a sensation of not emptying your bladder completely after you finish urinating?  0 - Not at all  2)  How often have you had to urinate again less than two hours after you finished urinating? 0 - Not at all  3)  How often have you found you stopped and started again several times when you urinated?  0 - Not at all  4) How difficult have you found it to postpone urination?  0 - Not at all  5) How often have you had a weak urinary stream?  0 - Not at all  6) How often have you had to push or strain to begin urination?  0 - Not at all  7) How many times did you most typically get up to urinate from the time you went to bed until the time you got up in the morning?  0 - None  Total score:  0-7 mildly symptomatic   8-19 moderately symptomatic   20-35 severely symptomatic    .Marland KitchenStart a regular exercise program and make sure you are eating a healthy diet Try to eat 4 servings of dairy a day or take a calcium supplement (500mg  twice a day). Your vaccines are up to date.  AUA normal.  Colonoscopy up to date.   Baseline EKG was done today. Sinus bradycardia at 59. No ST depression or elevation.  Discussed cholesterol and weight. Trying IF 16:8 for 6  months and will recheck cholesterol.

## 2019-08-03 ENCOUNTER — Other Ambulatory Visit: Payer: Self-pay | Admitting: *Deleted

## 2019-08-03 DIAGNOSIS — Z20822 Contact with and (suspected) exposure to covid-19: Secondary | ICD-10-CM

## 2019-08-05 LAB — NOVEL CORONAVIRUS, NAA: SARS-CoV-2, NAA: NOT DETECTED

## 2019-09-13 NOTE — Addendum Note (Signed)
Addended byAnnamaria Helling on: 09/13/2019 08:12 AM   Modules accepted: Orders

## 2020-01-26 IMAGING — DX DG LUMBAR SPINE COMPLETE 4+V
5 series · 5 of 5 positions shown · non-contrast
Comparison: None.

CLINICAL DATA: Low back pain.  No radiculopathy.

EXAM:
LUMBAR SPINE - COMPLETE 4+ VIEW

[l-spine ap]
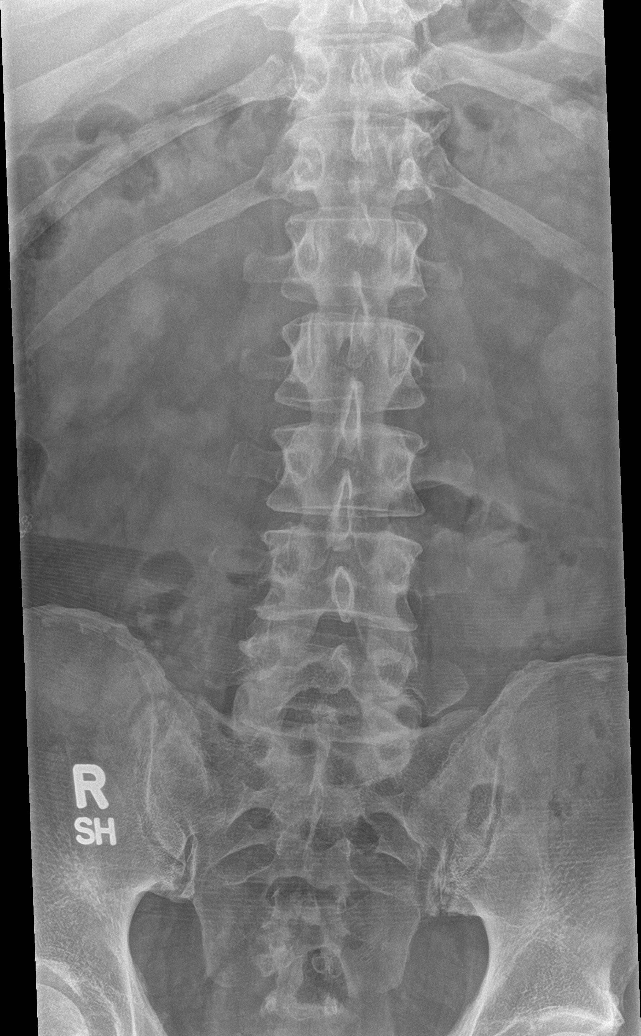

[l-spine obl (1 of 2)]
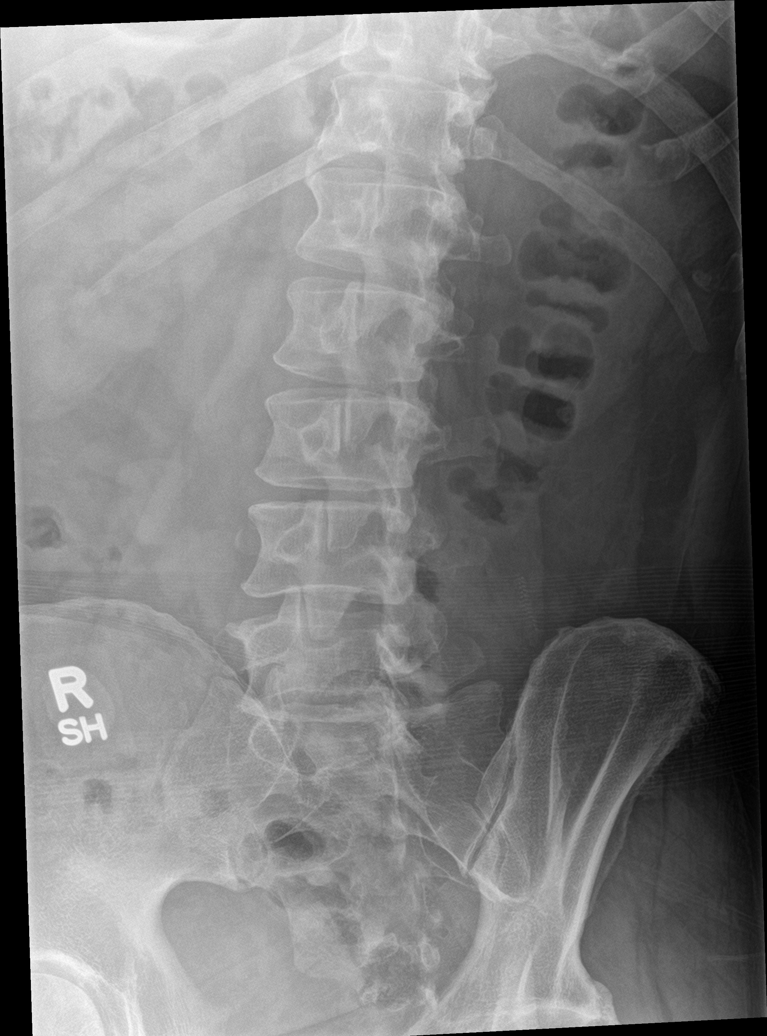

[l-spine obl (2 of 2)]
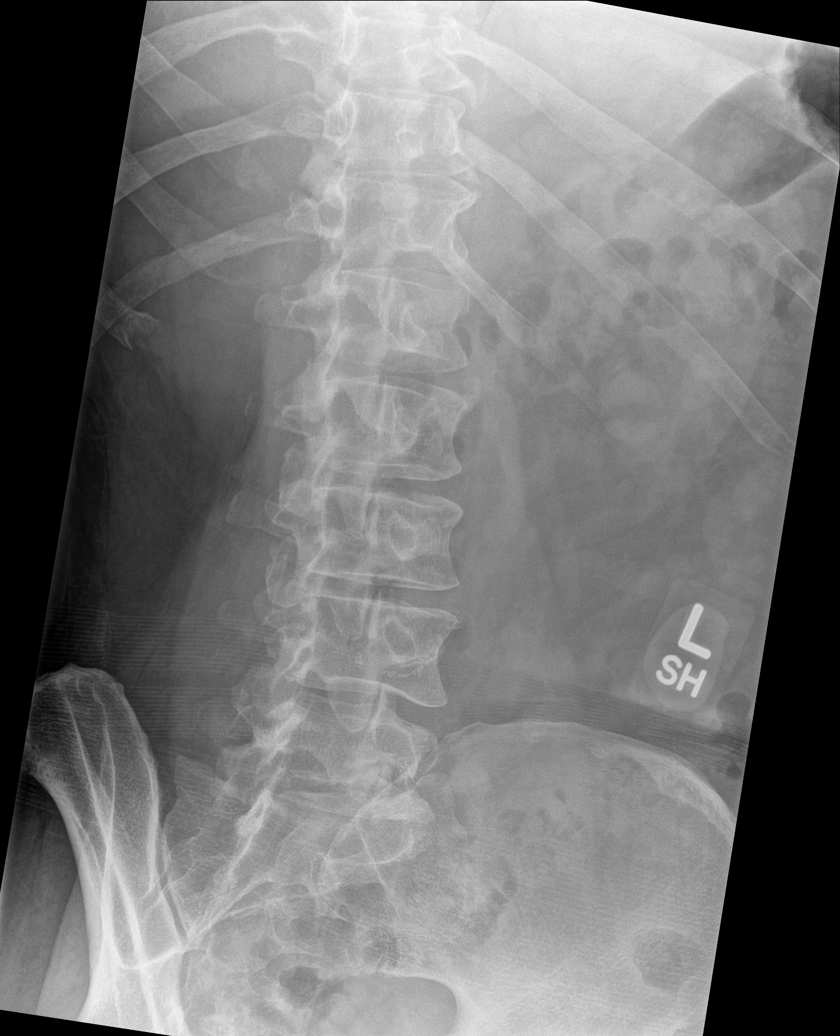

[l-spine lat]
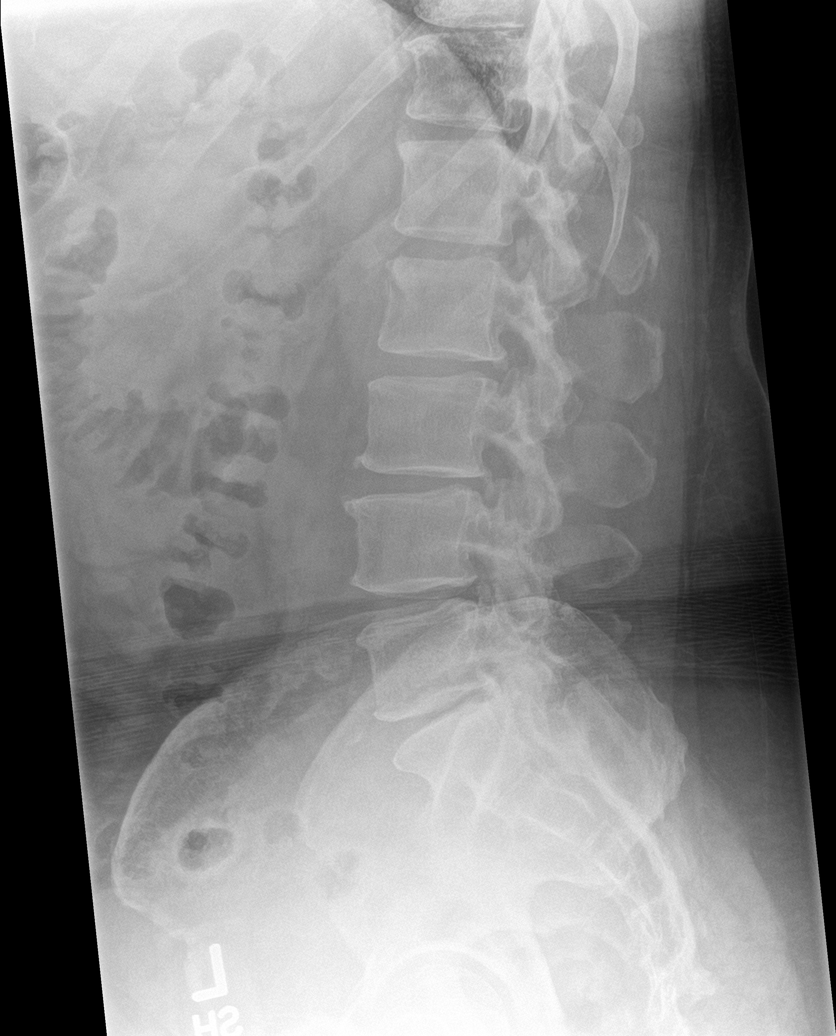

[l-spine spot]
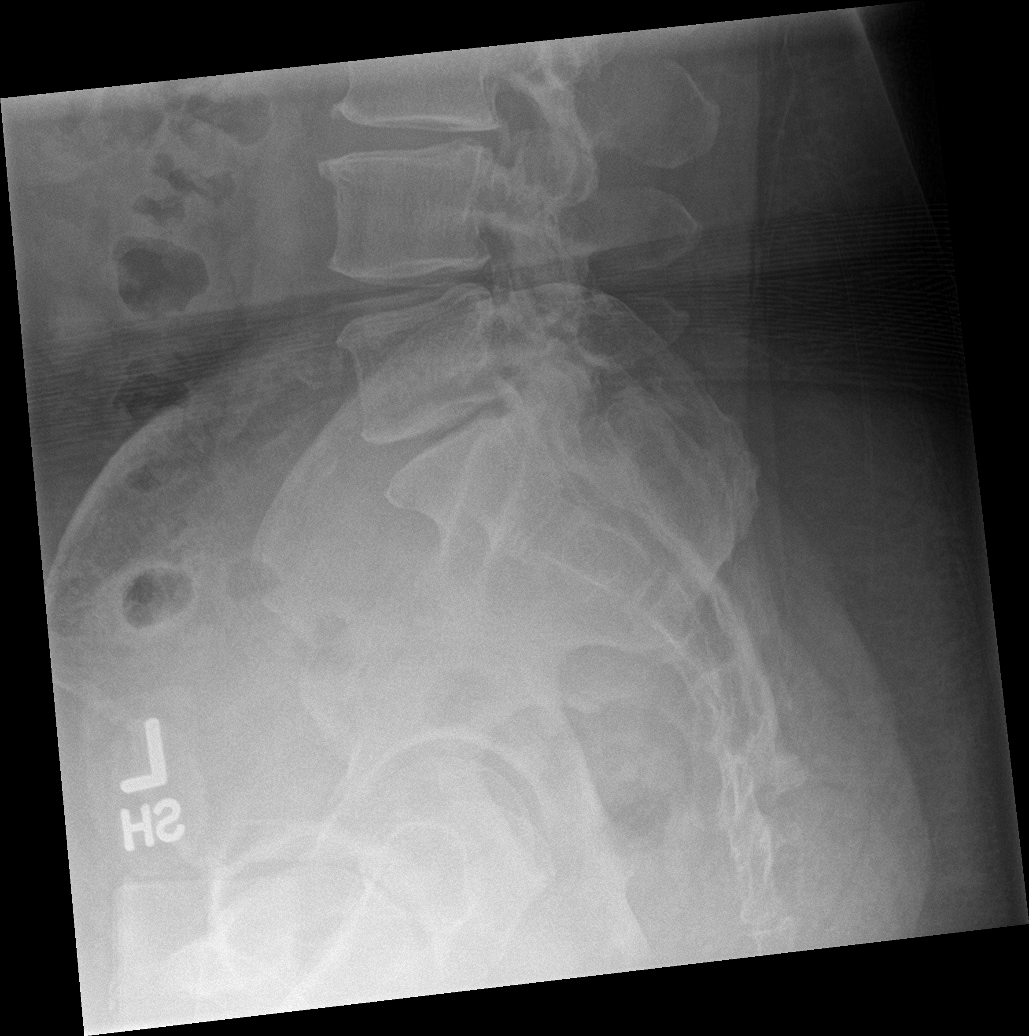

[5 of 5 positions shown; findings below may reference images not displayed]

FINDINGS: Generalized narrow appearance of discs that is greatest at L5-S1
where there is moderate to advanced narrowing. Small spondylotic
spurs. No noted facet spurring or listhesis. No evidence of fracture
or bone lesion.
IMPRESSION: 1. No acute finding.
2. Generalized disc narrowing greatest at L5-S1.

## 2020-02-23 ENCOUNTER — Ambulatory Visit: Payer: Self-pay | Attending: Internal Medicine

## 2020-02-23 DIAGNOSIS — Z23 Encounter for immunization: Secondary | ICD-10-CM

## 2020-02-23 NOTE — Progress Notes (Signed)
   Covid-19 Vaccination Clinic  Name:  Tommy Strickland    MRN: [id number] DOB: January 19, 1961  02/23/2020  Tommy Strickland was observed post Covid-19 immunization for 15 minutes without incident. He was provided with Vaccine Information Sheet and instruction to access the V-Safe system.   Tommy Strickland was instructed to call 911 with any severe reactions post vaccine: Marland Kitchen Difficulty breathing  . Swelling of face and throat  . A fast heartbeat  . A bad rash all over body  . Dizziness and weakness   Immunizations Administered    Name Date Dose VIS Date Route   Pfizer COVID-19 Vaccine 02/23/2020 12:58 PM 0.3 mL 10/27/2019 Intramuscular   Manufacturer: [place]   Lot: [id number]   [place]: [id number]

## 2020-03-18 ENCOUNTER — Ambulatory Visit: Payer: Self-pay | Attending: Internal Medicine

## 2020-03-18 DIAGNOSIS — Z23 Encounter for immunization: Secondary | ICD-10-CM

## 2020-03-18 NOTE — Progress Notes (Signed)
   Covid-19 Vaccination Clinic  Name:  Tommy Strickland    MRN: [id number] DOB: Sep 28, 1961  03/18/2020  Mr. Tommy Strickland was observed post Covid-19 immunization for 15 minutes without incident. He was provided with Vaccine Information Sheet and instruction to access the V-Safe system.   Mr. Tommy Strickland was instructed to call 911 with any severe reactions post vaccine: Marland Kitchen Difficulty breathing  . Swelling of face and throat  . A fast heartbeat  . A bad rash all over body  . Dizziness and weakness   Immunizations Administered    Name Date Dose VIS Date Route   Pfizer COVID-19 Vaccine 03/18/2020 12:58 PM 0.3 mL 01/10/2019 Intramuscular   Manufacturer: [place]   Lot: [id number]   [place]: [id number]

## 2021-05-27 ENCOUNTER — Other Ambulatory Visit: Payer: Self-pay

## 2021-05-27 ENCOUNTER — Ambulatory Visit: Payer: Managed Care, Other (non HMO) | Admitting: Physician Assistant

## 2021-05-27 VITALS — BP 136/67 | HR 78 | Ht 72.0 in | Wt 305.0 lb

## 2021-05-27 DIAGNOSIS — Z6841 Body Mass Index (BMI) 40.0 and over, adult: Secondary | ICD-10-CM

## 2021-05-27 DIAGNOSIS — Z0000 Encounter for general adult medical examination without abnormal findings: Secondary | ICD-10-CM | POA: Diagnosis not present

## 2021-05-27 DIAGNOSIS — Z1329 Encounter for screening for other suspected endocrine disorder: Secondary | ICD-10-CM

## 2021-05-27 DIAGNOSIS — Z131 Encounter for screening for diabetes mellitus: Secondary | ICD-10-CM | POA: Diagnosis not present

## 2021-05-27 DIAGNOSIS — E785 Hyperlipidemia, unspecified: Secondary | ICD-10-CM

## 2021-05-27 DIAGNOSIS — L570 Actinic keratosis: Secondary | ICD-10-CM

## 2021-05-27 DIAGNOSIS — Z125 Encounter for screening for malignant neoplasm of prostate: Secondary | ICD-10-CM

## 2021-05-27 MED ORDER — PHENTERMINE HCL 37.5 MG PO CAPS: 37.5000 mg | ORAL_CAPSULE | ORAL | 0 refills | Status: DC

## 2021-05-27 NOTE — Patient Instructions (Signed)
Actinic Keratosis °An actinic keratosis is a precancerous growth on the skin. If there is more than one growth, the condition is called actinic keratoses. Actinic keratoses appear most often on areas of skin that get a lot of sun exposure, including the scalp, face, ears, lips, upper back, forearms, and the backs of the hands. °If left untreated, these growths may develop into a skin cancer called squamous cell carcinoma. It is important to have all these growths checked by a health care provider to determine the best treatment approach. °What are the causes? °Actinic keratoses are caused by getting too much ultraviolet (UV) radiation from the sun or other UV light sources. °What increases the risk? °You are more likely to develop this condition if you: °Have light-colored skin and blue eyes. °Have blond or red hair. °Spend a lot of time in the sun. °Do not protect your skin from the sun when outdoors. °Are an older person. The risk of developing an actinic keratosis increases with age. °What are the signs or symptoms? °Actinic keratoses feel like scaly, rough spots of skin. Symptoms of this condition include growths that may: °Be as small as a pinhead or as big as a quarter. °Itch, hurt, or feel sensitive. °Be skin-colored, light tan, dark tan, pink, or a combination of any of these colors. In most cases, the growths become red. °Have a small piece of pink or gray skin (skin tag) growing from them. °It may be easier to notice actinic keratoses by feeling them, rather than seeing them. Sometimes, actinic keratoses disappear, but many reappear a few days to a few weeks later. °How is this diagnosed? °This condition is usually diagnosed with a physical exam. °A tissue sample may be removed from the actinic keratosis and examined under a microscope (biopsy). °How is this treated? °If needed, this condition may be treated by: °Scraping off the actinic keratosis (curettage). °Freezing the actinic keratosis with liquid  nitrogen (cryosurgery). This causes the growth to eventually fall off the skin. °Applying medicated creams or gels to destroy the cells in the growth. °Applying chemicals to the actinic keratosis to make the outer layers of skin peel off (chemical peel). °Using photodynamic therapy. In this procedure, medicated cream is applied to the actinic keratosis. This cream increases your skin's sensitivity to light. Then, a strong light is aimed at the actinic keratosis to destroy cells in the growth. °Follow these instructions at home: °Skin care °Apply cool, wet cloths (cool compresses) to the affected areas. °Do not scratch your skin. °Check your skin regularly for any growths, especially growths that: °Start to itch or bleed. °Change in size, shape, or color. °Caring for the treated area °Keep the treated area clean and dry as told by your health care provider. °Do not apply any medicine, cream, or lotion to the treated area unless your health care provider tells you to do that. °Do not pick at blisters or try to break them open. This can cause infection and scarring. °If you have red or irritated skin after treatment, follow instructions from your health care provider about how to take care of the treated area. Make sure you: °Wash your hands with soap and water before you change your bandage (dressing). If soap and water are not available, use hand sanitizer. °Change your dressing as told by your health care provider. °If you have red or irritated skin after treatment, check your treated area every day for signs of infection. Check for: °Redness, swelling, or pain. °Fluid or blood. °  Warmth. °Pus or a bad smell. °General instructions °Take or apply over-the-counter and prescription medicines only as told by your health care provider. °Return to your normal activities as told by your health care provider. Ask your health care provider what activities are safe for you. °Have a skin exam done every year by a health care  provider who is a skin specialist (dermatologist). °Keep all follow-up visits as told by your health care provider. This is important. °Lifestyle °Do not use any products that contain nicotine or tobacco, such as cigarettes and e-cigarettes. If you need help quitting, ask your health care provider. °Take steps to protect your skin from the sun. °Try to avoid the sun between 10:00 a.m. and 4:00 p.m. This is when the UV light is the strongest. °Use a sunscreen or sunblock with SPF 30 (sun protection factor 30) or greater. °Apply sunscreen before you are exposed to sunlight and reapply as often as directed by the instructions on the sunscreen container. °Always wear sunglasses that have UV protection, and always wear a hat and clothing to protect your skin from sunlight. °When possible, avoid medicines that increase your sensitivity to sunlight. °Do not use tanning beds or other indoor tanning devices. °Contact a health care provider if: °You notice any changes or new growths on your skin. °You have swelling, pain, or more redness around your treated area. °You have fluid or blood coming from your treated area. °Your treated area feels warm to the touch. °You have pus or a bad smell coming from your treated area. °You have a fever. °You have a blister that becomes large and painful. °Summary °An actinic keratosis is a precancerous growth on the skin. If there is more than one growth, the condition is called actinic keratoses. In some cases, if left untreated, these growths can develop into skin cancer. °Check your skin regularly for any growths, especially growths that start to itch or bleed, or change in size, shape, or color. °Take steps to protect your skin from the sun. °Contact a health care provider if you notice any changes or new growths on your skin. °Keep all follow-up visits as told by your health care provider. This is important. °This information is not intended to replace advice given to you by your  health care provider. Make sure you discuss any questions you have with your health care provider. °Document Revised: 03/15/2018 Document Reviewed: 03/15/2018 °Elsevier Patient Education © 2022 [employer] Inc. ° °

## 2021-05-27 NOTE — Progress Notes (Signed)
Subjective:    Patient ID: Tommy Strickland, male    DOB: August 21, 1961, 60 y.o.   MRN: [id number]  HPI Pt is a 60 yo obese male who presents to the clinic to get some areas on his nose and temple looked at. Hx of BCC. He noticed a few weeks ago some scaly red areas that he would scratch the scales off and then they would come right back.   He would like to work on weight loss again. Used phentermine a few years ago. Trying to prioritize health again. He started gaining weight while he was working at home during covid.   .. Active Ambulatory Problems    Diagnosis Date Noted   Lumbar degenerative disc disease 01/03/2018   Abnormal weight gain 01/03/2018   Skin cancer, basal cell 01/18/2018   Class 3 severe obesity due to excess calories without serious comorbidity with body mass index (BMI) of 40.0 to 44.9 in adult [facility]) 01/18/2018   Actinic keratoses 01/18/2018   Polyp of colon 01/23/2019   Hyperlipidemia LDL goal <100 05/08/2019   Resolved Ambulatory Problems    Diagnosis Date Noted   Pre-diabetes 01/18/2018   No Additional Past Medical History     Review of Systems  All other systems reviewed and are negative.     Objective:   Physical Exam Vitals reviewed.  Constitutional:      Appearance: Normal appearance. He is obese.  HENT:     Head: Normocephalic.  Neck:     Vascular: No carotid bruit.  Cardiovascular:     Rate and Rhythm: Normal rate and regular rhythm.     Pulses: Normal pulses.     Heart sounds: Normal heart sounds.  Pulmonary:     Effort: Pulmonary effort is normal.     Breath sounds: Normal breath sounds.  Skin:    Comments: Scaly patches on erythematous base on left temple and left nare.   Neurological:     General: No focal deficit present.     Mental Status: He is alert and oriented to person, place, and time.  Psychiatric:        Mood and Affect: Mood normal.  .. Depression screen [name] 2/9 05/27/2021 06/14/2019 05/01/2019 01/18/2018  Decreased Interest  0 0 0 0  Down, Depressed, Hopeless 0 0 0 0  PHQ - 2 Score 0 0 0 0  Altered sleeping 0 0 0 -  Tired, decreased energy 0 0 0 -  Change in appetite 0 0 0 -  Feeling bad or failure about yourself  0 0 0 -  Trouble concentrating 0 0 0 -  Moving slowly or fidgety/restless 0 0 0 -  Suicidal thoughts 0 0 0 -  PHQ-9 Score 0 0 0 -  Difficult doing work/chores Not difficult at all Not difficult at all Not difficult at all -           Assessment & Plan:  Tommy Strickland KitchenMarland KitchenEmillio was seen today for skin problem and obesity.  Diagnoses and all orders for this visit:  Preventative health care -     CBC with Differential/Platelet  Hyperlipidemia LDL goal <100 -     CBC with Differential/Platelet -     Lipid Panel w/reflex Direct LDL  Diabetes mellitus screening -     CBC with Differential/Platelet -     COMPLETE METABOLIC PANEL WITH GFR  Thyroid disorder screen -     TSH  Screening PSA (prostate specific antigen) -     PSA  Class  3 severe obesity due to excess calories without serious comorbidity with body mass index (BMI) of 40.0 to 44.9 in adult (HCC) -     phentermine 37.5 MG capsule; Take 1 capsule (37.5 mg total) by mouth every morning.  Actinic keratoses  Ordered screening labs. Pt would like to work on weight/diet changes first. Will check labs in 3 months and schedule CPE.   Tommy Strickland Kitchen.Discussed low carb diet with 1500 calories and 80g of protein.  Exercising at least 150 minutes a week.  [employer] could be a Microbiologist.  Good response to phentermine in the past. He would like to start again.  Sent phentermine.  Follow up in 3 months.   Cryotherapy Procedure Note  Pre-operative Diagnosis: Actinic keratosis  Post-operative Diagnosis: Actinic keratosis  Locations: left external nare and left temple.   Indications: pre-cancerous   Procedure Details  History of allergy to iodine: no. Pacemaker? no.  Patient informed of risks (permanent scarring, infection, light or dark  discoloration, bleeding, infection, weakness, numbness and recurrence of the lesion) and benefits of the procedure and verbal informed consent obtained.  The areas are treated with liquid nitrogen therapy, frozen until ice ball extended 2 mm beyond lesion, allowed to thaw, and treated again. The patient tolerated procedure well.  The patient was instructed on post-op care, warned that there may be blister formation, redness and pain. Recommend OTC analgesia as needed for pain.  Condition: Stable  Complications: none.  Plan: 1. Instructed to keep the area dry and covered for 24-48h and clean thereafter. 2. Warning signs of infection were reviewed.   3. Recommended that the patient use OTC acetaminophen as needed for pain.

## 2021-05-28 ENCOUNTER — Telehealth: Payer: Self-pay

## 2021-05-28 ENCOUNTER — Encounter: Payer: Self-pay | Admitting: Physician Assistant

## 2021-05-28 NOTE — Telephone Encounter (Signed)
Fax received with PA approval from 05/28/21 through 08/28/21. Approval faxed to pharmacy; confirmation obtained. Approval sent to be scanned.

## 2021-05-28 NOTE — Telephone Encounter (Signed)
Fax received from [facility] pharmacy regarding need for PA on phentermine 37.5mg .  Using Covermymeds, PA completed and submitted; awaiting reponse.

## 2021-05-31 ENCOUNTER — Encounter: Payer: Self-pay | Admitting: Physician Assistant

## 2021-06-10 ENCOUNTER — Encounter: Payer: Self-pay | Admitting: Physician Assistant

## 2021-06-10 ENCOUNTER — Other Ambulatory Visit: Payer: Self-pay | Admitting: Physician Assistant

## 2021-06-10 DIAGNOSIS — Z6841 Body Mass Index (BMI) 40.0 and over, adult: Secondary | ICD-10-CM

## 2021-06-10 NOTE — Telephone Encounter (Signed)
Called [facility] pharmacy and spoke with Marjory Lies.  RX for phentermine capsules cancelled.  Charyl Bigger, CMA

## 2021-06-11 MED ORDER — PHENTERMINE HCL 37.5 MG PO TABS: ORAL_TABLET | ORAL | 0 refills | Status: AC

## 2021-06-11 MED ORDER — PHENTERMINE HCL 37.5 MG PO TABS: 37.5000 mg | ORAL_TABLET | Freq: Every day | ORAL | 0 refills | Status: DC

## 2021-08-27 ENCOUNTER — Ambulatory Visit: Payer: Managed Care, Other (non HMO) | Admitting: Physician Assistant

## 2021-09-16 ENCOUNTER — Ambulatory Visit: Payer: Managed Care, Other (non HMO) | Admitting: Physician Assistant

## 2023-04-26 ENCOUNTER — Other Ambulatory Visit: Payer: Self-pay

## 2023-04-26 ENCOUNTER — Telehealth: Payer: Self-pay | Admitting: Physician Assistant

## 2023-04-26 MED ORDER — CYCLOBENZAPRINE HCL 10 MG PO TABS: 10.0000 mg | ORAL_TABLET | Freq: Three times a day (TID) | ORAL | 0 refills | Status: DC | PRN

## 2023-04-26 NOTE — Telephone Encounter (Signed)
Pt wife called office requesting Rx for back spasm or virtual visit. Pt wife states patient "tweaked his back yesterday mowing the grass and now can not get out of bed." Please advise.   CB:[phone]

## 2023-04-26 NOTE — Telephone Encounter (Signed)
Patient informed and requesting rx be moved to  [facility] [address] . [facility] to [facility] and cancelled at [facility]

## 2023-04-30 ENCOUNTER — Ambulatory Visit
Admission: RE | Admit: 2023-04-30 | Discharge: 2023-04-30 | Disposition: A | Discharge status: Home / Self Care | Payer: Managed Care, Other (non HMO) | Source: Ambulatory Visit | Attending: Family Medicine | Admitting: Family Medicine

## 2023-04-30 VITALS — BP 139/91 | HR 119 | Temp 97.4°F | Resp 17

## 2023-04-30 DIAGNOSIS — M62830 Muscle spasm of back: Secondary | ICD-10-CM

## 2023-04-30 DIAGNOSIS — S39012A Strain of muscle, fascia and tendon of lower back, initial encounter: Secondary | ICD-10-CM | POA: Diagnosis not present

## 2023-04-30 MED ORDER — METHYLPREDNISOLONE SODIUM SUCC 125 MG IJ SOLR
125.0000 mg | Freq: Once | INTRAMUSCULAR | Status: AC
  Administered 2023-04-30: 125 mg via INTRAMUSCULAR

## 2023-04-30 MED ORDER — METHOCARBAMOL 500 MG PO TABS: 500.0000 mg | ORAL_TABLET | Freq: Three times a day (TID) | ORAL | 0 refills | Status: DC | PRN

## 2023-04-30 MED ORDER — HYDROCODONE-ACETAMINOPHEN 5-325 MG PO TABS: 1.0000 | ORAL_TABLET | Freq: Three times a day (TID) | ORAL | 0 refills | Status: DC | PRN

## 2023-04-30 MED ORDER — PREDNISONE 10 MG (21) PO TBPK: ORAL_TABLET | Freq: Every day | ORAL | 0 refills | Status: DC

## 2023-04-30 MED ORDER — KETOROLAC TROMETHAMINE 60 MG/2ML IM SOLN
60.0000 mg | Freq: Once | INTRAMUSCULAR | Status: AC
  Administered 2023-04-30: 60 mg via INTRAMUSCULAR

## 2023-04-30 NOTE — Discharge Instructions (Addendum)
Advised patient to take medication as directed with food to completion.  Advised patient may use Robaxin daily or as needed for accompanying muscle spasms of lower back.  Advised patient may use Norco for breakthrough acute right sided low back pain.  Patient advised of sedative effects of this medication.  Encouraged patient to increase daily water intake to 64 ounces per day while taking this medication.  Advised if symptoms worsen and/or unresolved please follow-up with PCP or here for further evaluation.

## 2023-04-30 NOTE — ED Triage Notes (Addendum)
Pt c/o lower RT sided back pain since Sunday. Says he was mowing when he felt a sharp pain. Similar pain 3-4 years ago. Aleve and flexeril prn. Last dose last night 10p. Hx of DDD.

## 2023-04-30 NOTE — ED Provider Notes (Signed)
[name]    CSN: [id number] Arrival date & time: 04/30/23  4098      History   Chief Complaint Chief Complaint  Patient presents with   Back Pain    Lower RT    HPI Tommy Strickland is a 62 y.o. male.   HPI Pleasant 62 year old male presents with right-sided low back pain for 5 days.  Reports he was mowing his lawn when he felt a sharp pain in this area.  Additionally, patient reports this area was exacerbated by carrying heavy groceries upstairs with 1 arm shortly after mowing the grass.  Reports similar pain 3 to 4 years ago.  Reports taking Aleve and Flexeril last night around 10 PM. PMH significant for morbid obesity, lumbar degenerative disc disease, and BCC.  History reviewed. No pertinent past medical history.  Patient Active Problem List   Diagnosis Date Noted   Hyperlipidemia LDL goal <100 05/08/2019   Polyp of colon 01/23/2019   Skin cancer, basal cell 01/18/2018   Class 3 severe obesity due to excess calories without serious comorbidity with body mass index (BMI) of 40.0 to 44.9 in adult (HCC) 01/18/2018   Actinic keratoses 01/18/2018   Lumbar degenerative disc disease 01/03/2018   Abnormal weight gain 01/03/2018    Past Surgical History:  Procedure Laterality Date   ACHILLES TENDON REPAIR     SMALL INTESTINE SURGERY         Home Medications    Prior to Admission medications   Medication Sig Start Date End Date Taking? Authorizing Provider  HYDROcodone-acetaminophen (NORCO/VICODIN) 5-325 MG tablet Take 1 tablet by mouth every 8 (eight) hours as needed. 04/30/23  Yes Trevor Iha, FNP  methocarbamol (ROBAXIN) 500 MG tablet Take 1 tablet (500 mg total) by mouth every 8 (eight) hours as needed for muscle spasms. 04/30/23  Yes Trevor Iha, FNP  predniSONE (STERAPRED UNI-PAK 21 TAB) 10 MG (21) TBPK tablet Take by mouth daily. Take 6 tabs by mouth daily  for 2 days, then 5 tabs for 2 days, then 4 tabs for 2 days, then 3 tabs for 2 days, 2 tabs for  2 days, then 1 tab by mouth daily for 2 days 04/30/23  Yes Trevor Iha, FNP  phentermine (ADIPEX-P) 37.5 MG tablet One tab by mouth qAM 06/11/21   Jomarie Longs, PA-C    Family History Family History  Problem Relation Age of Onset   Stroke Paternal Grandfather     Social History Social History   Tobacco Use   Smoking status: Never   Smokeless tobacco: Never  Vaping Use   Vaping Use: Never used  Substance Use Topics   Alcohol use: No    Alcohol/week: 1.0 standard drink of alcohol    Types: 1 Cans of beer per week   Drug use: No     Allergies   Patient has no known allergies.   Review of Systems Review of Systems  Musculoskeletal:  Positive for back pain.       Patient reports intense right-sided low back pain from mowing grass/lifting heavy groceries with 1 arm 5 days ago.  Patient currently wearing weight training belt and using crutch for walking assistance.  All other systems reviewed and are negative.    Physical Exam Triage Vital Signs ED Triage Vitals  Enc Vitals Group     BP 04/30/23 0903 (!) 139/91     Pulse Rate 04/30/23 0903 (!) 119     Resp 04/30/23 0903 17  Temp 04/30/23 0903 (!) 97.4 F (36.3 C)     Temp Source 04/30/23 0903 Oral     SpO2 04/30/23 0903 98 %     Weight --      Height --      Head Circumference --      Peak Flow --      Pain Score 04/30/23 0908 4     Pain Loc --      Pain Edu? --      Excl. in GC? --    No data found.  Updated Vital Signs BP (!) 139/91 (BP Location: Right Arm)   Pulse (!) 119   Temp (!) 97.4 F (36.3 C) (Oral)   Resp 17   SpO2 98%     Physical Exam Vitals and nursing note reviewed.  Constitutional:      General: He is not in acute distress.    Appearance: Normal appearance. He is obese. He is ill-appearing. He is not toxic-appearing or diaphoretic.  HENT:     Head: Normocephalic and atraumatic.     Mouth/Throat:     Mouth: Mucous membranes are moist.     Pharynx: Oropharynx is clear.   Eyes:     Extraocular Movements: Extraocular movements intact.     Conjunctiva/sclera: Conjunctivae normal.     Pupils: Pupils are equal, round, and reactive to light.  Cardiovascular:     Rate and Rhythm: Normal rate and regular rhythm.     Pulses: Normal pulses.     Heart sounds: Normal heart sounds.  Pulmonary:     Effort: Pulmonary effort is normal.     Breath sounds: Normal breath sounds. No wheezing, rhonchi or rales.  Musculoskeletal:        General: Normal range of motion.     Cervical back: Normal range of motion and neck supple.     Comments: Patient currently wearing a weight belt to support lower back.  Reports intense pain from right lumbar area over spinal erectors with muscle spasms.  Skin:    General: Skin is warm and dry.  Neurological:     General: No focal deficit present.     Mental Status: He is alert and oriented to person, place, and time. Mental status is at baseline.     Gait: Gait abnormal.  Psychiatric:        Mood and Affect: Mood normal.        Behavior: Behavior normal.        Thought Content: Thought content normal.        Judgment: Judgment normal.      UC Treatments / Results  Labs (all labs ordered are listed, but only abnormal results are displayed) Labs Reviewed - No data to display  EKG   Radiology No results found.  Procedures Procedures (including critical care time)  Medications Ordered in UC Medications  ketorolac (TORADOL) injection 60 mg (60 mg Intramuscular Given 04/30/23 0943)  methylPREDNISolone sodium succinate (SOLU-MEDROL) 125 mg/2 mL injection 125 mg (125 mg Intramuscular Given 04/30/23 0943)    Initial Impression / Assessment and Plan / UC Course  I have reviewed the triage vital signs and the nursing notes.  Pertinent labs & imaging results that were available during my care of the patient were reviewed by me and considered in my medical decision making (see chart for details).     MDM: 1.  Strain of lumbar  region, initial encounter-IM Toradol 60 mg given once in clinic, IM Solu-Medrol 125 mg  given once and clinic, Rx'd Sterapred Unipak (tapering from 60 mg to 10 mg over 10 days), Rx'd Norco 5/325 mg tablet: Take 1 tab every 8 hours, as needed for breakthrough acute right-sided lower back pain; 2.  Muscle spasm of back-Rx'd Robaxin 500 mg every 8 hours, as needed for muscle spasms of back. Advised patient to take medication as directed with food to completion.  Advised patient may use Robaxin daily or as needed for accompanying muscle spasms of lower back.  Advised patient may use Norco for breakthrough acute right sided low back pain.  Patient advised of sedative effects of this medication.  Encouraged patient to increase daily water intake to 64 ounces per day while taking this medication.  Advised if symptoms worsen and/or unresolved please follow-up with PCP or here for further evaluation.  Patient discharged home, hemodynamically stable.   Final Clinical Impressions(s) / UC Diagnoses   Final diagnoses:  Strain of lumbar region, initial encounter  Muscle spasm of back     Discharge Instructions      Advised patient to take medication as directed with food to completion.  Advised patient may use Robaxin daily or as needed for accompanying muscle spasms of lower back.  Advised patient may use Norco for breakthrough acute right sided low back pain.  Patient advised of sedative effects of this medication.  Encouraged patient to increase daily water intake to 64 ounces per day while taking this medication.  Advised if symptoms worsen and/or unresolved please follow-up with PCP or here for further evaluation.     ED Prescriptions     Medication Sig Dispense Auth. Provider   predniSONE (STERAPRED UNI-PAK 21 TAB) 10 MG (21) TBPK tablet Take by mouth daily. Take 6 tabs by mouth daily  for 2 days, then 5 tabs for 2 days, then 4 tabs for 2 days, then 3 tabs for 2 days, 2 tabs for 2 days, then 1 tab by  mouth daily for 2 days 42 tablet Trevor Iha, FNP   methocarbamol (ROBAXIN) 500 MG tablet Take 1 tablet (500 mg total) by mouth every 8 (eight) hours as needed for muscle spasms. 24 tablet Trevor Iha, FNP   HYDROcodone-acetaminophen (NORCO/VICODIN) 5-325 MG tablet Take 1 tablet by mouth every 8 (eight) hours as needed. 12 tablet Trevor Iha, FNP      I have reviewed the PDMP during this encounter.   Trevor Iha, FNP 04/30/23 [phone]

## 2023-05-03 ENCOUNTER — Telehealth: Payer: Self-pay | Admitting: Physician Assistant

## 2023-05-03 ENCOUNTER — Ambulatory Visit (INDEPENDENT_AMBULATORY_CARE_PROVIDER_SITE_OTHER): Payer: Managed Care, Other (non HMO)

## 2023-05-03 ENCOUNTER — Ambulatory Visit (INDEPENDENT_AMBULATORY_CARE_PROVIDER_SITE_OTHER): Payer: Managed Care, Other (non HMO) | Admitting: Sports Medicine

## 2023-05-03 DIAGNOSIS — M51369 Other intervertebral disc degeneration, lumbar region without mention of lumbar back pain or lower extremity pain: Secondary | ICD-10-CM

## 2023-05-03 DIAGNOSIS — M5136 Other intervertebral disc degeneration, lumbar region: Secondary | ICD-10-CM

## 2023-05-03 MED ORDER — HYDROCODONE-ACETAMINOPHEN 10-325 MG PO TABS: 1.0000 | ORAL_TABLET | Freq: Three times a day (TID) | ORAL | 0 refills | Status: DC | PRN

## 2023-05-03 NOTE — Telephone Encounter (Signed)
Patient needs prior authorization for HYDROcodone-acetaminophen [facility]) 10-325 MG tablet [[id number]]   Patient was informed by the pharmacy that he needs a prior authorization for this medication.

## 2023-05-03 NOTE — Progress Notes (Signed)
    Procedures performed today:    None.  Independent interpretation of notes and tests performed by another provider:   None.  Brief History, Exam, Impression, and Recommendations:    Lumbar degenerative disc disease Pleasant 62 year old male, I saw him about 5 years ago for discogenic low back pain and he responded to conservative treatment. Now after mowing the grass has recurrence of severe axial low back pain without radiculopathy, no weakness, no bowel or bladder dysfunction, seen in urgent care and given Toradol, Solu-Medrol, prednisone taper, Robaxin. Only minimal improvements. On exam he has good strength, good motion but limited straight leg raise due to pain. We discussed the importance of initial pain control and then aggressive physical therapy afterwards. Return to see me in 6 weeks.    ____________________________________________ Ihor Austin. Benjamin Stain, M.D., ABFM., CAQSM., AME. Primary Care and Sports Medicine [facility]  Adjunct Professor of Family Medicine  [employer]  Restaurant manager, fast food

## 2023-05-03 NOTE — Assessment & Plan Note (Signed)
Pleasant 62 year old male, I saw him about 5 years ago for discogenic low back pain and he responded to conservative treatment. Now after mowing the grass has recurrence of severe axial low back pain without radiculopathy, no weakness, no bowel or bladder dysfunction, seen in urgent care and given Toradol, Solu-Medrol, prednisone taper, Robaxin. Only minimal improvements. On exam he has good strength, good motion but limited straight leg raise due to pain. We discussed the importance of initial pain control and then aggressive physical therapy afterwards. Return to see me in 6 weeks.

## 2023-05-04 ENCOUNTER — Encounter: Payer: Self-pay | Admitting: Sports Medicine

## 2023-05-04 NOTE — Telephone Encounter (Signed)
Pt called. He is following up on PA for Hydrocodone.

## 2023-05-06 ENCOUNTER — Ambulatory Visit: Payer: Managed Care, Other (non HMO) | Admitting: Physical Therapy

## 2023-05-12 NOTE — Therapy (Unsigned)
OUTPATIENT PHYSICAL THERAPY THORACOLUMBAR EVALUATION   Patient Name: Tommy Strickland MRN: [id number] DOB:07/15/1961, 62 y.o., male Today's Date: 05/13/2023  END OF SESSION:   History reviewed. No pertinent past medical history. Past Surgical History:  Procedure Laterality Date   ACHILLES TENDON REPAIR     SMALL INTESTINE SURGERY     Patient Active Problem List   Diagnosis Date Noted   Hyperlipidemia LDL goal <100 05/08/2019   Polyp of colon 01/23/2019   Skin cancer, basal cell 01/18/2018   Class 3 severe obesity due to excess calories without serious comorbidity with body mass index (BMI) of 40.0 to 44.9 in adult (HCC) 01/18/2018   Actinic keratoses 01/18/2018   Lumbar degenerative disc disease 01/03/2018   Abnormal weight gain 01/03/2018    PCP: Tandy Gaw, PA-C  REFERRING PROVIDER: Dr Monica Becton  REFERRING DIAG: Lumbar DDD  Rationale for Evaluation and Treatment: Rehabilitation  THERAPY DIAG:  Other low back pain  Other symptoms and signs involving the musculoskeletal system  Muscle weakness (generalized)  ONSET DATE: 04/29/23  SUBJECTIVE:                                                                                                                                                                                           SUBJECTIVE STATEMENT: Patient reports that he was athletic in high school and college. He was mowing the yard and reached for a pine cone when he felt a "ping" in the lower R back. He did have some radiating pain in the R LE initially but has resolved. He normally will place wear a brace for a few days and things will resolve. This time he was in bed and unable to move for 3 days. He was seen in urgent care and treated with medication with good improvement. He is still in the brace with movement.   PERTINENT HISTORY:  History of LBP for several years ~ 5-6 years about once a year - initial problem in 30's; arthritis; R achilles tendon  rupture with repair 2002   PAIN:  Are you having pain? Yes: NPRS scale: 0-1/10 Pain location: R low back  Pain description: dull; nagging  Aggravating factors: lean to R; prolonged sitting(feels compression)  Relieving factors: meds; lying on L side   PRECAUTIONS: None  WEIGHT BEARING RESTRICTIONS: No  FALLS:  Has patient fallen in last 6 months? 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 No osteolytic or osteoblastic changes. Degenerative disc disease at each lumbar level with marginal osteophyte formation noted. Disc space narrowing L5-S1. Grade 1 L5 retrolisthesis. No spondylolysis. No osteolytic or osteoblastic lesions identified.  PATIENT SURVEYS:  FOTO 44; goal 36  SCREENING FOR RED FLAGS: Bowel or bladder incontinence: No Spinal tumors: No Cauda equina syndrome: No Compression fracture: No Abdominal aneurysm: No  COGNITION: Overall cognitive status: Within functional limits for tasks assessed     SENSATION: WFL  MUSCLE LENGTH: Hamstrings: Right 65 deg; Left 70 deg Thomas test: tight hip flexors R > L  POSTURE: rounded shoulders, forward head, flexed trunk , and weight shift left  PALPATION: Muscular tightness R lats; QL; posterior hip  Pain with PA spring testing lower lumbar spine   LUMBAR ROM:   AROM eval  Flexion 50%  Extension 70%  Right lateral flexion 60% tight; discomfort   Left lateral flexion 70% tight center  Right rotation 75%  Left rotation 75%   (Blank rows = not tested)  LOWER EXTREMITY ROM:     Active  Right eval Left eval  Hip  flexion    Hip extension    Hip abduction    Hip adduction    Hip internal rotation    Hip external rotation    Knee flexion    Knee extension    Ankle dorsiflexion    Ankle plantarflexion    Ankle inversion    Ankle eversion     (Blank rows = not tested)  LOWER EXTREMITY MMT:    MMT Right eval Left eval  Hip flexion    Hip extension    Hip abduction    Hip adduction    Hip internal rotation    Hip external rotation    Knee flexion    Knee extension    Ankle dorsiflexion    Ankle plantarflexion    Ankle inversion    Ankle eversion     (Blank rows = not tested)  LUMBAR SPECIAL TESTS:  Straight leg raise test: Negative and Slump test: Negative  FUNCTIONAL TESTS:  5 times sit to stand:   GAIT: Distance walked: 40 ft Assistive device utilized: None Level of assistance: Complete Independence Comments: slign limp R LE in wt bearing R  [facility] Adult PT Treatment:                                                DATE: 05/13/23 Therapeutic Exercise: Prone  Prone press up 2-3 sec x 10  Supine  Hamstring stretch w/ strap 30 sec x 2 ITB stretch w/strap 30 sec x 2  Piriformis stretch travel 30 sec x 2 Core activation 10 sec x 10 - difficulty with 4 part core - pt will work on pelvic floor contraction and transverse abdominals before adding multifidi  Manual Therapy: Consider manual work and DN at next visit  Modalities: Consider TENS for pain management as needed Self Care: Issued Brewing technologist     PATIENT EDUCATION:  Education details: POC; HEP  Person educated: Patient Education method: Programmer, multimedia, Facilities manager, Actor cues, Verbal cues, and Handouts Education comprehension: verbalized understanding, returned demonstration, verbal cues required, tactile cues required, and needs  further education  HOME EXERCISE PROGRAM: Access Code: [email or web address] URL: [email or web address] Date: 05/13/2023 Prepared  by: Corlis Leak  Exercises - Prone Press Up  - 2 x daily - 7 x weekly - 1 sets - 10 reps - 2-3 sec  hold - Standing Lumbar Extension  - 2 x daily - 7 x weekly - 1 sets - 2-3 reps - 2-3 sec  hold - Supine Transversus Abdominis Bracing with Pelvic Floor Contraction  - 2 x daily - 7 x weekly - 1 sets - 10 reps - 10sec  hold - Hooklying Hamstring Stretch with Strap  - 2 x daily - 7 x weekly - 1 sets - 3 reps - 30 sec  hold - Supine ITB Stretch with Strap  - 2 x daily - 7 x weekly - 1 sets - 3 reps - 30 sec  hold - Supine Piriformis Stretch with Leg Straight  - 2 x daily - 7 x weekly - 1 sets - 3 reps - 30 sec  hold  Patient Education - Hospital doctor - Office Posture - Trigger Point Dry Needling  ASSESSMENT:  CLINICAL IMPRESSION: Patient is a 62 y.o. male who was seen today for physical therapy evaluation and treatment for lumbar DDD.   OBJECTIVE IMPAIRMENTS: decreased activity tolerance, decreased endurance, decreased mobility, decreased ROM, decreased strength, hypomobility, increased muscle spasms, impaired flexibility, improper body mechanics, postural dysfunction, and pain.   ACTIVITY LIMITATIONS: carrying, lifting, bending, sitting, standing, squatting, dressing, and reach over head  PARTICIPATION LIMITATIONS: cleaning, laundry, driving, occupation, and yard work  PERSONAL FACTORS: Behavior pattern, Past/current experiences, and Profession are also affecting patient's functional outcome.   REHAB POTENTIAL: Good  CLINICAL DECISION MAKING: Stable/uncomplicated  EVALUATION COMPLEXITY: Low   GOALS: Goals reviewed with patient? Yes  SHORT TERM GOALS: Target date: 06/24/2023  Independent in initial HEP  Baseline: Goal status: INITIAL   2. Decrease tightness in the R lumbar spine with patient reporting ability to turn in bed and move sit to stand with no pain    Baseline:    Goal status:  INITIAL   LONG TERM GOALS: Target date: 08/05/2023   Decrease pain in R  lumbar spine by 80-100% allowing patient to return to all normal functional activities  Baseline:  Goal status: INITIAL  2.  Trunk and LE mobility/ROM WFL's and pain free Baseline:  Goal status: INITIAL  3.  Improve core strength and stability with patient to demonstrate good core control for 30 min of exercise  Baseline:  Goal status: INITIAL  4.  Patient to demonstrate and verbalize good body mechanics for transitional movements and lifting  Baseline:  Goal status: INITIAL  5.  Independent in HEP including aquatic program as indicated  Baseline:  Goal status: INITIAL  6.  Improve functional limitation score to 64 Baseline: 44 Goal status: INITIAL  PLAN:  PT FREQUENCY: 2x/week  PT DURATION: 12 weeks  PLANNED INTERVENTIONS: Therapeutic exercises, Therapeutic activity, Neuromuscular re-education, Balance training, Gait training, Patient/Family education, Self Care, Joint mobilization, Aquatic Therapy, Dry Needling, Electrical stimulation, Spinal mobilization, Cryotherapy, Moist heat, Taping, Traction, Ultrasound, Ionotophoresis 4mg /ml Dexamethasone, Manual therapy, and Re-evaluation.  PLAN FOR NEXT SESSION: review and progress with exercise; continue with back care and spinal education; manual work, DN, modalities as indicated    [employer], PT 05/13/2023, 10:49 AM

## 2023-05-13 ENCOUNTER — Encounter: Payer: Self-pay | Admitting: Rehabilitative and Restorative Service Providers"

## 2023-05-13 ENCOUNTER — Other Ambulatory Visit: Payer: Self-pay

## 2023-05-13 ENCOUNTER — Ambulatory Visit
Payer: Managed Care, Other (non HMO) | Attending: Sports Medicine | Admitting: Rehabilitative and Restorative Service Providers"

## 2023-05-13 DIAGNOSIS — R29898 Other symptoms and signs involving the musculoskeletal system: Secondary | ICD-10-CM | POA: Diagnosis present

## 2023-05-13 DIAGNOSIS — M5136 Other intervertebral disc degeneration, lumbar region: Secondary | ICD-10-CM | POA: Diagnosis present

## 2023-05-13 DIAGNOSIS — M5459 Other low back pain: Secondary | ICD-10-CM | POA: Insufficient documentation

## 2023-05-13 DIAGNOSIS — M6281 Muscle weakness (generalized): Secondary | ICD-10-CM | POA: Insufficient documentation

## 2023-05-14 ENCOUNTER — Telehealth: Payer: Self-pay

## 2023-05-14 NOTE — Telephone Encounter (Addendum)
Initiated Prior authorization ZOX:[id number]  tablets Via: [employer], controlled must be called in Case/Key:[id number] Status: Pending as of 05/14/23 Reason: Notified Pt via: Mychart

## 2023-05-17 ENCOUNTER — Ambulatory Visit: Payer: Managed Care, Other (non HMO) | Attending: Sports Medicine

## 2023-05-17 DIAGNOSIS — R29898 Other symptoms and signs involving the musculoskeletal system: Secondary | ICD-10-CM | POA: Diagnosis present

## 2023-05-17 DIAGNOSIS — M6281 Muscle weakness (generalized): Secondary | ICD-10-CM | POA: Insufficient documentation

## 2023-05-17 DIAGNOSIS — M5459 Other low back pain: Secondary | ICD-10-CM | POA: Insufficient documentation

## 2023-05-17 NOTE — Therapy (Signed)
OUTPATIENT PHYSICAL THERAPY THORACOLUMBAR TREATMENT   Patient Name: Tommy Strickland MRN: [id number] DOB:May 09, 1961, 62 y.o., male Today's Date: 05/17/2023  END OF SESSION:  PT End of Session - 05/17/23 1448     Visit Number 2    Number of Visits 24    Date for PT Re-Evaluation 08/05/23    Authorization Type [id number]    Authorization Time Period AUTH REQUIRED AFTER 5TH VISIT    Authorization - Visit Number 2    Authorization - Number of Visits 5    PT Start Time 1450    PT Stop Time 1530    PT Time Calculation (min) 40 min    Activity Tolerance Patient tolerated treatment well    Behavior During Therapy [facility] for tasks assessed/performed             History reviewed. No pertinent past medical history. Past Surgical History:  Procedure Laterality Date   ACHILLES TENDON REPAIR     SMALL INTESTINE SURGERY     Patient Active Problem List   Diagnosis Date Noted   Hyperlipidemia LDL goal <100 05/08/2019   Polyp of colon 01/23/2019   Skin cancer, basal cell 01/18/2018   Class 3 severe obesity due to excess calories without serious comorbidity with body mass index (BMI) of 40.0 to 44.9 in adult (HCC) 01/18/2018   Actinic keratoses 01/18/2018   Lumbar degenerative disc disease 01/03/2018   Abnormal weight gain 01/03/2018    PCP: Tandy Gaw, PA-C  REFERRING PROVIDER: Dr Monica Becton  REFERRING DIAG: Lumbar DDD  Rationale for Evaluation and Treatment: Rehabilitation  THERAPY DIAG:  Other low back pain  Other symptoms and signs involving the musculoskeletal system  Muscle weakness (generalized)  ONSET DATE: 04/29/23  SUBJECTIVE:                                                                                                                                                                                           SUBJECTIVE STATEMENT: Patient reports he is feeling much better than eval and stopped taking prescription pain medication. Patient states he  continues to have a mild "dull ache that is tender" along R side of low back, describes the sensation as " what is left over from initial pain". Patient reports 1/10 pain today in low back, states the soreness on R side is primary symptom. Patient states he is planning on starting at-home yoga videos with wife.   EVAL: Patient reports that he was athletic in high school and college. He was mowing the yard and reached for a pine cone when he felt a "ping" in the lower R back. He did  have some radiating pain in the R LE initially but has resolved. He normally will place wear a brace for a few days and things will resolve. This time he was in bed and unable to move for 3 days. He was seen in urgent care and treated with medication with good improvement. He is still in the brace with movement.   PERTINENT HISTORY:  History of LBP for several years ~ 5-6 years about once a year - initial problem in 30's; arthritis; R achilles tendon rupture with repair 2002   PAIN:  Are you having pain? Yes: NPRS scale: 0-1/10 Pain location: R low back  Pain description: dull; nagging  Aggravating factors: lean to R; prolonged sitting(feels compression)  Relieving factors: meds; lying on L side   PRECAUTIONS: None  WEIGHT BEARING RESTRICTIONS: No  FALLS:  Has patient fallen in last 6 months? 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 No osteolytic or osteoblastic changes. Degenerative disc disease at each lumbar level with marginal osteophyte formation noted. Disc space narrowing L5-S1. Grade 1 L5 retrolisthesis.  No spondylolysis. No osteolytic or osteoblastic lesions identified.  PATIENT SURVEYS:  FOTO 44; goal 64  SCREENING FOR RED FLAGS: Bowel or bladder incontinence: No Spinal tumors: No Cauda equina syndrome: No Compression fracture: No Abdominal aneurysm: No  COGNITION: Overall cognitive status: Within functional limits for tasks assessed     SENSATION: WFL  MUSCLE LENGTH: Hamstrings: Right 65 deg; Left 70 deg Thomas test: tight hip flexors R > L  POSTURE: rounded shoulders, forward head, flexed trunk , and weight shift left  PALPATION: Muscular tightness R lats; QL; posterior hip  Pain with PA spring testing lower lumbar spine   LUMBAR ROM:   AROM eval  Flexion 50%  Extension 70%  Right lateral flexion 60% tight; discomfort   Left lateral flexion 70% tight center  Right rotation 75%  Left rotation 75%   (Blank rows = not tested)  LOWER EXTREMITY ROM:     Active  Right eval Left eval  Hip flexion    Hip extension    Hip abduction    Hip adduction    Hip internal rotation    Hip external rotation    Knee flexion    Knee extension    Ankle dorsiflexion    Ankle plantarflexion    Ankle inversion    Ankle eversion     (Blank rows = not tested)  LOWER EXTREMITY MMT:    MMT Right eval Left eval  Hip flexion    Hip extension    Hip abduction    Hip adduction    Hip internal rotation    Hip external rotation    Knee flexion    Knee extension    Ankle dorsiflexion    Ankle plantarflexion    Ankle inversion    Ankle eversion     (Blank rows = not tested)  LUMBAR SPECIAL TESTS:  Straight leg raise test: Negative and Slump test: Negative  FUNCTIONAL TESTS:  5 times sit to stand:   GAIT: Distance walked: 40 ft Assistive device utilized: None Level of assistance: Complete Independence  Comments: slign limp R LE in wt bearing R  [facility] Adult PT Treatment:                                                DATE: 05/17/2023 Therapeutic Exercise: NuStep L5  x 5 min Prone press up 10x3" Hooklying HS stretch 5x10" Supine ITB stretch with strap 2x30" Diaphragmatic breathing + TA activation Slow core marching Modified single leg stretch Hooklying hip add iso ball squeeze Modified double leg stretch (ball b/w knees, therapist providing LE support) Seated deadlift 0# --> 5#KB --> 7#DB  Standing lumbar extension Counter L stretch    [facility] Adult PT Treatment:                                                DATE: 05/13/23 Therapeutic Exercise: Prone  Prone press up 2-3 sec x 10  Supine  Hamstring stretch w/ strap 30 sec x 2 ITB stretch w/strap 30 sec x 2  Piriformis stretch travel 30 sec x 2 Core activation 10 sec x 10 - difficulty with 4 part core - pt will work on pelvic floor contraction and transverse abdominals before adding multifidi  Manual Therapy: Consider manual work and DN at next visit  Modalities: Consider TENS for pain management as needed Self Care: Issued Brewing technologist     PATIENT EDUCATION:  Education details: POC; HEP  Person educated: Patient Education method: Programmer, multimedia, Facilities manager, Actor cues, Verbal cues, and Handouts Education comprehension: verbalized understanding, returned demonstration, verbal cues required, tactile cues required, and needs further education  HOME EXERCISE PROGRAM: Access Code: [email or web address] URL: [email or web address] Date: 05/13/2023 Prepared by: Corlis Leak  Exercises - Prone Press Up  - 2 x daily - 7 x weekly - 1 sets - 10 reps - 2-3 sec  hold - Standing Lumbar Extension  - 2 x daily - 7 x weekly - 1 sets - 2-3 reps - 2-3 sec  hold - Supine Transversus Abdominis Bracing with Pelvic Floor Contraction  - 2 x daily - 7 x weekly - 1 sets - 10 reps - 10sec  hold - Hooklying Hamstring Stretch with Strap  - 2 x daily - 7 x weekly - 1 sets - 3 reps - 30 sec  hold - Supine ITB Stretch with Strap  - 2 x daily - 7 x weekly - 1  sets - 3 reps - 30 sec  hold - Supine Piriformis Stretch with Leg Straight  - 2 x daily - 7 x weekly - 1 sets - 3 reps - 30 sec  hold  Patient Education - Hospital doctor - Office Posture - Trigger Point Dry Needling  ASSESSMENT:  CLINICAL IMPRESSION: HEP reviewed; tactile and verbal cueing provided to improve postural alignment. Core strengthening progressed with pilates-focused abdominal exercises. Hip hinge body mechanics progressed with seated deadlifts; gradual increase in resistance to challenge postural stability.   OBJECTIVE IMPAIRMENTS: decreased activity tolerance, decreased endurance, decreased mobility, decreased ROM, decreased strength, hypomobility, increased muscle spasms, impaired flexibility, improper body mechanics, postural dysfunction, and pain.   ACTIVITY LIMITATIONS: carrying, lifting, bending, sitting, standing, squatting, dressing, and reach over head  PARTICIPATION LIMITATIONS: cleaning, laundry, driving, occupation, and yard  work  PERSONAL FACTORS: Behavior pattern, Past/current experiences, and Profession are also affecting patient's functional outcome.   REHAB POTENTIAL: Good  CLINICAL DECISION MAKING: Stable/uncomplicated  EVALUATION COMPLEXITY: Low   GOALS: Goals reviewed with patient? Yes  SHORT TERM GOALS: Target date: 06/24/2023  Independent in initial HEP  Baseline: Goal status: INITIAL   2. Decrease tightness in the R lumbar spine with patient reporting ability to turn in bed and move sit to stand with no pain    Baseline:    Goal status:  INITIAL   LONG TERM GOALS: Target date: 08/05/2023  Decrease pain in R lumbar spine by 80-100% allowing patient to return to all normal functional activities  Baseline:  Goal status: INITIAL  2.  Trunk and LE mobility/ROM WFL's and pain free Baseline:  Goal status: INITIAL  3.  Improve core strength and stability with patient to demonstrate good core control for 30 min of exercise   Baseline:  Goal status: INITIAL  4.  Patient to demonstrate and verbalize good body mechanics for transitional movements and lifting  Baseline:  Goal status: INITIAL  5.  Independent in HEP including aquatic program as indicated  Baseline:  Goal status: INITIAL  6.  Improve functional limitation score to 64 Baseline: 44 Goal status: INITIAL  PLAN:  PT FREQUENCY: 2x/week  PT DURATION: 12 weeks  PLANNED INTERVENTIONS: Therapeutic exercises, Therapeutic activity, Neuromuscular re-education, Balance training, Gait training, Patient/Family education, Self Care, Joint mobilization, Aquatic Therapy, Dry Needling, Electrical stimulation, Spinal mobilization, Cryotherapy, Moist heat, Taping, Traction, Ultrasound, Ionotophoresis 4mg /ml Dexamethasone, Manual therapy, and Re-evaluation.  PLAN FOR NEXT SESSION: review and progress with exercise; continue with back care and spinal education; manual work, DN, modalities as indicated    Sanjuana Mae, PTA 05/17/2023, 3:35 PM

## 2023-05-21 ENCOUNTER — Ambulatory Visit: Payer: Managed Care, Other (non HMO)

## 2023-05-21 DIAGNOSIS — M6281 Muscle weakness (generalized): Secondary | ICD-10-CM

## 2023-05-21 DIAGNOSIS — M5459 Other low back pain: Secondary | ICD-10-CM

## 2023-05-21 DIAGNOSIS — R29898 Other symptoms and signs involving the musculoskeletal system: Secondary | ICD-10-CM

## 2023-05-21 NOTE — Therapy (Signed)
OUTPATIENT PHYSICAL THERAPY THORACOLUMBAR TREATMENT   Patient Name: Tommy Strickland MRN: [id number] DOB:17-Oct-1961, 62 y.o., male Today's Date: 05/21/2023  END OF SESSION:  PT End of Session - 05/21/23 0759     Visit Number 3    Number of Visits 24    Date for PT Re-Evaluation 08/05/23    Authorization Type [id number]    Authorization Time Period AUTH REQUIRED AFTER 5TH VISIT    Authorization - Visit Number 3    Authorization - Number of Visits 5    PT Start Time 0800    PT Stop Time 0843    PT Time Calculation (min) 43 min    Activity Tolerance Patient tolerated treatment well    Behavior During Therapy [facility] for tasks assessed/performed             History reviewed. No pertinent past medical history. Past Surgical History:  Procedure Laterality Date   ACHILLES TENDON REPAIR     SMALL INTESTINE SURGERY     Patient Active Problem List   Diagnosis Date Noted   Hyperlipidemia LDL goal <100 05/08/2019   Polyp of colon 01/23/2019   Skin cancer, basal cell 01/18/2018   Class 3 severe obesity due to excess calories without serious comorbidity with body mass index (BMI) of 40.0 to 44.9 in adult (HCC) 01/18/2018   Actinic keratoses 01/18/2018   Lumbar degenerative disc disease 01/03/2018   Abnormal weight gain 01/03/2018    PCP: Tandy Gaw, PA-C  REFERRING PROVIDER: Dr Monica Becton  REFERRING DIAG: Lumbar DDD  Rationale for Evaluation and Treatment: Rehabilitation  THERAPY DIAG:  Other low back pain  Other symptoms and signs involving the musculoskeletal system  Muscle weakness (generalized)  ONSET DATE: 04/29/23  SUBJECTIVE:                                                                                                                                                                                           SUBJECTIVE STATEMENT: Patient reports his R side of back is feeling tight today, states he did some exercises in the pool and felt tight in the  back the next day. Patient states he started using the chair yoga app with his wife.   EVAL: Patient reports that he was athletic in high school and college. He was mowing the yard and reached for a pine cone when he felt a "ping" in the lower R back. He did have some radiating pain in the R LE initially but has resolved. He normally will place wear a brace for a few days and things will resolve. This time he was in bed and unable  to move for 3 days. He was seen in urgent care and treated with medication with good improvement. He is still in the brace with movement.   PERTINENT HISTORY:  History of LBP for several years ~ 5-6 years about once a year - initial problem in 30's; arthritis; R achilles tendon rupture with repair 2002   PAIN:  Are you having pain? Yes: NPRS scale: 0-1/10 Pain location: R low back  Pain description: dull; nagging  Aggravating factors: lean to R; prolonged sitting(feels compression)  Relieving factors: meds; lying on L side   PRECAUTIONS: None  WEIGHT BEARING RESTRICTIONS: No  FALLS:  Has patient fallen in last 6 months? 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 No osteolytic or osteoblastic changes. Degenerative disc disease at each lumbar level with marginal osteophyte formation noted. Disc space narrowing L5-S1. Grade 1 L5 retrolisthesis. No spondylolysis. No osteolytic or osteoblastic lesions identified.  PATIENT SURVEYS:  FOTO 44; goal 59  SCREENING FOR RED FLAGS: Bowel or bladder incontinence: No Spinal tumors: No Cauda equina syndrome: No Compression  fracture: No Abdominal aneurysm: No  COGNITION: Overall cognitive status: Within functional limits for tasks assessed     SENSATION: WFL  MUSCLE LENGTH: Hamstrings: Right 65 deg; Left 70 deg Thomas test: tight hip flexors R > L  POSTURE: rounded shoulders, forward head, flexed trunk , and weight shift left  PALPATION: Muscular tightness R lats; QL; posterior hip  Pain with PA spring testing lower lumbar spine   LUMBAR ROM:   AROM eval  Flexion 50%  Extension 70%  Right lateral flexion 60% tight; discomfort   Left lateral flexion 70% tight center  Right rotation 75%  Left rotation 75%   (Blank rows = not tested)  LOWER EXTREMITY ROM:     Active  Right eval Left eval  Hip flexion    Hip extension    Hip abduction    Hip adduction    Hip internal rotation    Hip external rotation    Knee flexion    Knee extension    Ankle dorsiflexion    Ankle plantarflexion    Ankle inversion    Ankle eversion     (Blank rows = not tested)  LOWER EXTREMITY MMT:    MMT Right eval Left eval  Hip flexion    Hip extension    Hip abduction    Hip adduction    Hip internal rotation    Hip external rotation    Knee flexion    Knee extension    Ankle dorsiflexion    Ankle plantarflexion    Ankle inversion    Ankle eversion     (Blank rows = not tested)  LUMBAR SPECIAL TESTS:  Straight leg raise test: Negative and Slump test: Negative  FUNCTIONAL TESTS:  5 times sit to stand:   GAIT: Distance walked: 40 ft Assistive device utilized: None Level of assistance: Complete Independence Comments: slign limp R LE in wt bearing R  [facility] Adult PT Treatment:  DATE: 05/21/2023 Therapeutic Exercise: Treadmill warm-up L 2.0 mph, 0% incline x 5 min Prone press up 10x3" Supine HS & ITB stretches 2x30" each LTR 10x3" S/L open books 10x3" Dead bug progression Prone hip extension x10 Seated red SB roll out front 10x5" Seated  deadlift 10#KB x10 Standing deadlift with dowel --> squat deadlift --> added bilateral 5#DB  Modified single leg deadlift    [facility] Adult PT Treatment:                                                DATE: 05/17/2023 Therapeutic Exercise: NuStep L5 x 5 min Prone press up 10x3" Hooklying HS stretch 5x10" Supine ITB stretch with strap 2x30" Diaphragmatic breathing + TA activation Slow core marching Modified single leg stretch Hooklying hip add iso ball squeeze Modified double leg stretch (ball b/w knees, therapist providing LE support) Seated deadlift 0# --> 5#KB --> 7#DB  Standing lumbar extension Counter L stretch    [facility] Adult PT Treatment:                                                DATE: 05/13/23 Therapeutic Exercise: Prone  Prone press up 2-3 sec x 10  Supine  Hamstring stretch w/ strap 30 sec x 2 ITB stretch w/strap 30 sec x 2  Piriformis stretch travel 30 sec x 2 Core activation 10 sec x 10 - difficulty with 4 part core - pt will work on pelvic floor contraction and transverse abdominals before adding multifidi  Manual Therapy: Consider manual work and DN at next visit  Modalities: Consider TENS for pain management as needed Self Care: Issued Brewing technologist     PATIENT EDUCATION:  Education details: POC; HEP  Person educated: Patient Education method: Programmer, multimedia, Facilities manager, Actor cues, Verbal cues, and Handouts Education comprehension: verbalized understanding, returned demonstration, verbal cues required, tactile cues required, and needs further education  HOME EXERCISE PROGRAM: Access Code: [email or web address] URL: [email or web address] Date: 05/21/2023 Prepared by: Carlynn Herald  Exercises - Prone Press Up  - 2 x daily - 7 x weekly - 1 sets - 10 reps - 2-3 sec  hold - Standing Lumbar Extension  - 2 x daily - 7 x weekly - 1 sets - 2-3 reps - 2-3 sec  hold - Supine Transversus Abdominis Bracing  with Pelvic Floor Contraction  - 2 x daily - 7 x weekly - 1 sets - 10 reps - 10sec  hold - Hooklying Hamstring Stretch with Strap  - 2 x daily - 7 x weekly - 1 sets - 3 reps - 30 sec  hold - Supine ITB Stretch with Strap  - 2 x daily - 7 x weekly - 1 sets - 3 reps - 30 sec  hold - Supine Piriformis Stretch with Leg Straight  - 2 x daily - 7 x weekly - 1 sets - 3 reps - 30 sec  hold - Supine Lower Trunk Rotation  - 1 x daily - 7 x weekly - 2 sets - 10 reps - 3-5 sec hold - Sidelying Open Book Thoracic Lumbar Rotation and Extension  - 1 x daily - 7 x weekly - 2 sets - 10 reps -  3-5 sec hold - Dead Bug  - 1 x daily - 7 x weekly - 3 sets - 10 reps - Prone Hip Extension  - 1 x daily - 7 x weekly - 3 sets - 10 reps - Deadlift With Dumbbells  - 1 x daily - 7 x weekly - 3 sets - 10 reps - Modified Single-Leg Deadlift  - 1 x daily - 7 x weekly - 3 sets - 10 reps  Patient Education - Hospital doctor - Office Posture - Trigger Point Dry Needling  ASSESSMENT:  CLINICAL IMPRESSION: Thoracolumbar mobility progressed with trunk rotation variations. Deadlift  exercise progressed to standing with light resistance; dowel utilized to improve spinal alignment and hip hinge mechanics. Verbal cueing required for movement coordination during dead bug exercise.    OBJECTIVE IMPAIRMENTS: decreased activity tolerance, decreased endurance, decreased mobility, decreased ROM, decreased strength, hypomobility, increased muscle spasms, impaired flexibility, improper body mechanics, postural dysfunction, and pain.   ACTIVITY LIMITATIONS: carrying, lifting, bending, sitting, standing, squatting, dressing, and reach over head  PARTICIPATION LIMITATIONS: cleaning, laundry, driving, occupation, and yard work  PERSONAL FACTORS: Behavior pattern, Past/current experiences, and Profession are also affecting patient's functional outcome.   REHAB POTENTIAL: Good  CLINICAL DECISION MAKING:  Stable/uncomplicated  EVALUATION COMPLEXITY: Low   GOALS: Goals reviewed with patient? Yes  SHORT TERM GOALS: Target date: 06/24/2023  Independent in initial HEP  Baseline: Goal status: INITIAL   2. Decrease tightness in the R lumbar spine with patient reporting ability to turn in bed and move sit to stand with no pain    Baseline:    Goal status:  INITIAL   LONG TERM GOALS: Target date: 08/05/2023  Decrease pain in R lumbar spine by 80-100% allowing patient to return to all normal functional activities  Baseline:  Goal status: INITIAL  2.  Trunk and LE mobility/ROM WFL's and pain free Baseline:  Goal status: INITIAL  3.  Improve core strength and stability with patient to demonstrate good core control for 30 min of exercise  Baseline:  Goal status: INITIAL  4.  Patient to demonstrate and verbalize good body mechanics for transitional movements and lifting  Baseline:  Goal status: INITIAL  5.  Independent in HEP including aquatic program as indicated  Baseline:  Goal status: INITIAL  6.  Improve functional limitation score to 64 Baseline: 44 Goal status: INITIAL  PLAN:  PT FREQUENCY: 2x/week  PT DURATION: 12 weeks  PLANNED INTERVENTIONS: Therapeutic exercises, Therapeutic activity, Neuromuscular re-education, Balance training, Gait training, Patient/Family education, Self Care, Joint mobilization, Aquatic Therapy, Dry Needling, Electrical stimulation, Spinal mobilization, Cryotherapy, Moist heat, Taping, Traction, Ultrasound, Ionotophoresis 4mg /ml Dexamethasone, Manual therapy, and Re-evaluation.  PLAN FOR NEXT SESSION: DN next visit. Progress with exercise; continue with back care and spinal education; manual work, DN, modalities as indicated    Sanjuana Mae, PTA 05/21/2023, 8:45 AM

## 2023-05-24 ENCOUNTER — Encounter: Payer: Self-pay | Admitting: Rehabilitative and Restorative Service Providers"

## 2023-05-24 ENCOUNTER — Ambulatory Visit: Payer: Managed Care, Other (non HMO) | Admitting: Rehabilitative and Restorative Service Providers"

## 2023-05-24 DIAGNOSIS — M5459 Other low back pain: Secondary | ICD-10-CM

## 2023-05-24 DIAGNOSIS — R29898 Other symptoms and signs involving the musculoskeletal system: Secondary | ICD-10-CM

## 2023-05-24 DIAGNOSIS — M6281 Muscle weakness (generalized): Secondary | ICD-10-CM

## 2023-05-24 NOTE — Therapy (Addendum)
 OUTPATIENT PHYSICAL THERAPY THORACOLUMBAR TREATMENT  PHYSICAL THERAPY DISCHARGE SUMMARY  Visits from Start of Care: 4  Current functional level related to goals / functional outcomes: See progress note for discharge status    Remaining deficits: Needs to continue with HEP    Education / Equipment: HEP    Patient agrees to discharge. Patient goals were partially met. Patient is being discharged due to being pleased with the current functional level.  Tommy Strickland PT, MPH 01/24/24 10:41 AM     Patient Name: Tommy Strickland MRN: [id number] DOB:10-01-1961, 62 y.o., male Today's Date: 05/24/2023  END OF SESSION:  PT End of Session - 05/24/23 0804     Visit Number 4    Number of Visits 24    Date for PT Re-Evaluation 08/05/23    Authorization Type [employer] [id number]    Authorization Time Period AUTH REQUIRED AFTER 5TH VISIT    Authorization - Visit Number 4    Authorization - Number of Visits 5    PT Start Time 0802    PT Stop Time 0845    PT Time Calculation (min) 43 min             History reviewed. No pertinent past medical history. Past Surgical History:  Procedure Laterality Date   ACHILLES TENDON REPAIR     SMALL INTESTINE SURGERY     Patient Active Problem List   Diagnosis Date Noted   Hyperlipidemia LDL goal <100 05/08/2019   Polyp of colon 01/23/2019   Skin cancer, basal cell 01/18/2018   Class 3 severe obesity due to excess calories without serious comorbidity with body mass index (BMI) of 40.0 to 44.9 in adult (HCC) 01/18/2018   Actinic keratoses 01/18/2018   Lumbar degenerative disc disease 01/03/2018   Abnormal weight gain 01/03/2018    PCP: Tandy Gaw, PA-C  REFERRING PROVIDER: Dr Monica Becton  REFERRING DIAG: Lumbar DDD  Rationale for Evaluation and Treatment: Rehabilitation  THERAPY DIAG:  Other low back pain  Other symptoms and signs involving the musculoskeletal system  Muscle weakness (generalized)  ONSET DATE:  04/29/23  SUBJECTIVE:                                                                                                                                                                                           SUBJECTIVE STATEMENT: Patient reports his R side of back is doing okay. He has been off the pain meds for at least a week. He has some dull ache in the LB more often in the morning and later in the evening. He is using the lumbar brace when outside working in  the yard. He has a pool at [employer] and has been doing some chair yoga. He is working on the [employer] on the app as well. 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    EVAL: Patient reports that he was athletic in high school and college. He was mowing the yard and reached for a pine cone when he felt a "ping" in the lower R back. He did have some radiating pain in the R LE initially but has resolved. He normally will place wear a brace for a few days and things will resolve. This time he was in bed and unable to move for 3 days. He was seen in urgent care and treated with medication with good improvement. He is still in the brace with movement.   PERTINENT HISTORY:  History of LBP for several years ~ 5-6 years about once a year - initial problem in 30's; arthritis; R achilles tendon rupture with repair 2002   PAIN:  Are you having pain? Yes: NPRS scale: 0-1/10 Pain location: R low back  Pain description: dull; nagging  Aggravating factors: lean to R; prolonged sitting(feels compression)  Relieving factors: meds; lying on L side   PRECAUTIONS: None  WEIGHT BEARING RESTRICTIONS: No  FALLS:  Has patient fallen in last 6 months? 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 No osteolytic or osteoblastic changes. Degenerative disc disease at each lumbar level with marginal osteophyte formation noted. Disc space narrowing L5-S1. Grade 1 L5 retrolisthesis. No spondylolysis. No osteolytic or osteoblastic lesions identified.  PATIENT SURVEYS:  FOTO 44; goal 64  MUSCLE LENGTH: Hamstrings: Right 65 deg; Left 70 deg Thomas test: tight hip flexors R > L  POSTURE: rounded shoulders, forward head, flexed trunk , and weight shift left  PALPATION: Muscular tightness R lats; QL; posterior hip  Pain with PA spring testing lower lumbar spine   LUMBAR ROM:   AROM eval  Flexion 50%  Extension 70%  Right lateral flexion 60% tight; discomfort   Left lateral flexion 70% tight center  Right rotation 75%  Left rotation 75%   (Blank rows = not tested)  LOWER EXTREMITY ROM:     Active  Right eval Left eval  Hip flexion    Hip extension    Hip abduction    Hip adduction    Hip internal rotation    Hip external rotation    Knee flexion    Knee extension    Ankle dorsiflexion    Ankle plantarflexion    Ankle inversion    Ankle eversion     (Blank rows = not tested)  LOWER EXTREMITY MMT:    MMT Right eval Left eval  Hip flexion    Hip extension    Hip abduction    Hip adduction    Hip internal rotation    Hip external rotation    Knee flexion    Knee extension    Ankle dorsiflexion    Ankle plantarflexion    Ankle inversion    Ankle eversion     (Blank rows = not tested)  LUMBAR SPECIAL TESTS:  Straight leg raise test: Negative and Slump test: Negative  FUNCTIONAL TESTS:  5 times sit to  stand:   GAIT: Distance walked: 40 ft Assistive device utilized: None Level of assistance: Complete Independence Comments: slign limp R LE in wt bearing R  [facility] Adult PT Treatment:                                                DATE: 05/24/2023 Therapeutic Exercise: Treadmill warm-up L 2.0 mph, 0% incline x 6 min Hip flexor stretch  sitting 30 sec x 2 R/L  Prone press up 10x3" Supine hamstring stretch supine 30 sec x 2  ITB stretches supine 30 sec x 2  Piriformis stretch supine travell 30 sec x 2  4 part core supine 10 sec x 10  Sit to stand core engaged x 10  Wall squat core engaged 10 sec hold x 10  Antirotation blue TB 3 sec x 8 R/L   Manual Therapy: Skilled palpation to assess response to manual work and DN  Trigger Point Dry-Needling  Treatment instructions: Expect mild to moderate muscle soreness. S/S of pneumothorax if dry needled over a lung field, and to seek immediate medical attention should they occur. Patient verbalized understanding of these instructions and education. Patient Consent Given: Yes Education handout provided: Previously provided Muscles treated: R QL Electrical stimulation performed: No Parameters: N/A Treatment response/outcome: decreased palpable tightness; greater ease with prone press up Modalities: Consider TENS for pain management as needed Self Care: Continued education re posture and body mechanics handout Discussed desk ergonomics handout  Encouraged patient to engage core as he thinks about it during the day    [facility] Adult PT Treatment:                                                DATE: 05/21/2023 Therapeutic Exercise: Treadmill warm-up L 2.0 mph, 0% incline x 5 min Prone press up 10x3" Supine HS & ITB stretches 2x30" each LTR 10x3" S/L open books 10x3" Dead bug progression Prone hip extension x10 Seated red SB roll out front 10x5" Seated deadlift 10#KB x10 Standing deadlift with dowel --> squat deadlift --> added bilateral 5#DB  Modified single leg deadlift   PATIENT EDUCATION:  Education details: POC; HEP  Person educated: Patient Education method: Programmer, multimedia, Demonstration, Tactile cues, Verbal cues, and Handouts Education comprehension: verbalized understanding, returned demonstration, verbal cues required, tactile cues required, and needs further  education  HOME EXERCISE PROGRAM: Access Code: [email or web address] URL: [email or web address] Date: 05/24/2023 Prepared by: Corlis Leak  Exercises - Prone Press Up  - 2 x daily - 7 x weekly - 1 sets - 10 reps - 2-3 sec  hold - Standing Lumbar Extension  - 2 x daily - 7 x weekly - 1 sets - 2-3 reps - 2-3 sec  hold - Supine Transversus Abdominis Bracing with Pelvic Floor Contraction  - 2 x daily - 7 x weekly - 1 sets - 10 reps - 10sec  hold - Hooklying Hamstring Stretch with Strap  - 2 x daily - 7 x weekly - 1 sets - 3 reps - 30 sec  hold - Supine ITB Stretch with Strap  - 2 x daily - 7 x weekly - 1 sets - 3 reps - 30 sec  hold - Supine Piriformis Stretch with Leg Straight  -  2 x daily - 7 x weekly - 1 sets - 3 reps - 30 sec  hold - Supine Lower Trunk Rotation  - 1 x daily - 7 x weekly - 2 sets - 10 reps - 3-5 sec hold - Sidelying Open Book Thoracic Lumbar Rotation and Extension  - 1 x daily - 7 x weekly - 2 sets - 10 reps - 3-5 sec hold - Dead Bug  - 1 x daily - 7 x weekly - 3 sets - 10 reps - Prone Hip Extension  - 1 x daily - 7 x weekly - 3 sets - 10 reps - Deadlift With Dumbbells  - 1 x daily - 7 x weekly - 3 sets - 10 reps - Modified Single-Leg Deadlift  - 1 x daily - 7 x weekly - 3 sets - 10 reps - Seated Hip Flexor Stretch  - 2 x daily - 7 x weekly - 1 sets - 3 reps - 30 sec  hold - Sit to Stand  - 2 x daily - 7 x weekly - 1 sets - 10 reps - 3-5 sec  hold - Wall Quarter Squat  - 2 x daily - 7 x weekly - 1-2 sets - 10 reps - 5-10 sec  hold - Anti-Rotation Lateral Stepping with Press  - 2 x daily - 7 x weekly - 1-2 sets - 10 reps - 2-3 sec  hold  Patient Education - Hospital doctor - Office Posture - Trigger Point Dry Needling  ASSESSMENT:  CLINICAL IMPRESSION: Gradually improving symptoms with decreasing pain. Patient is no longer taking pain meds. He is working on exercises at home. Trial of DN for R QL tolerated fairly well for 2 needles. May benefit from  additional needling if he tolerates. Continued with stretching and strengthening. Progressed core stabilization exercises.  Continued back care education. Discussed use of lumbar support and progression of core strengthening to wean from dependency on brace.  GOALS: Goals reviewed with patient? Yes  SHORT TERM GOALS: Target date: 06/24/2023  Independent in initial HEP  Baseline: Goal status: INITIAL   2. Decrease tightness in the R lumbar spine with patient reporting ability to turn in bed and move sit to stand with no pain    Baseline:    Goal status:  INITIAL   LONG TERM GOALS: Target date: 08/05/2023  Decrease pain in R lumbar spine by 80-100% allowing patient to return to all normal functional activities  Baseline:  Goal status: INITIAL  2.  Trunk and LE mobility/ROM WFL's and pain free Baseline:  Goal status: INITIAL  3.  Improve core strength and stability with patient to demonstrate good core control for 30 min of exercise  Baseline:  Goal status: INITIAL  4.  Patient to demonstrate and verbalize good body mechanics for transitional movements and lifting  Baseline:  Goal status: INITIAL  5.  Independent in HEP including aquatic program as indicated  Baseline:  Goal status: INITIAL  6.  Improve functional limitation score to 64 Baseline: 44 Goal status: INITIAL  PLAN:  PT FREQUENCY: 2x/week  PT DURATION: 12 weeks  PLANNED INTERVENTIONS: Therapeutic exercises, Therapeutic activity, Neuromuscular re-education, Balance training, Gait training, Patient/Family education, Self Care, Joint mobilization, Aquatic Therapy, Dry Needling, Electrical stimulation, Spinal mobilization, Cryotherapy, Moist heat, Taping, Traction, Ultrasound, Ionotophoresis 4mg /ml Dexamethasone, Manual therapy, and Re-evaluation.  PLAN FOR NEXT SESSION: Progress with exercise; continue with back care and spinal education; manual work, DN, modalities as indicated    Ethridge Sollenberger  Rober Minion, PT 05/24/2023, 8:04  AM

## 2023-05-27 ENCOUNTER — Ambulatory Visit: Payer: Managed Care, Other (non HMO) | Admitting: Rehabilitative and Restorative Service Providers"

## 2023-05-31 ENCOUNTER — Encounter: Payer: Managed Care, Other (non HMO) | Admitting: Rehabilitative and Restorative Service Providers"

## 2023-06-02 ENCOUNTER — Encounter: Payer: Managed Care, Other (non HMO) | Admitting: Rehabilitative and Restorative Service Providers"

## 2023-06-14 ENCOUNTER — Ambulatory Visit: Payer: Managed Care, Other (non HMO) | Admitting: Physician Assistant

## 2023-06-17 ENCOUNTER — Ambulatory Visit: Payer: Managed Care, Other (non HMO) | Admitting: Sports Medicine

## 2023-12-05 ENCOUNTER — Ambulatory Visit: Payer: Managed Care, Other (non HMO)

## 2024-01-03 ENCOUNTER — Ambulatory Visit
Admission: RE | Admit: 2024-01-03 | Discharge: 2024-01-03 | Disposition: A | Discharge status: Home / Self Care | Payer: Managed Care, Other (non HMO) | Source: Ambulatory Visit

## 2024-01-03 VITALS — BP 121/76 | HR 91 | Temp 99.0°F | Resp 18

## 2024-01-03 DIAGNOSIS — R509 Fever, unspecified: Secondary | ICD-10-CM

## 2024-01-03 DIAGNOSIS — R059 Cough, unspecified: Secondary | ICD-10-CM

## 2024-01-03 MED ORDER — BENZONATATE 200 MG PO CAPS: 200.0000 mg | ORAL_CAPSULE | Freq: Three times a day (TID) | ORAL | 0 refills | Status: AC | PRN

## 2024-01-03 MED ORDER — AZITHROMYCIN 250 MG PO TABS: 250.0000 mg | ORAL_TABLET | Freq: Every day | ORAL | 0 refills | Status: AC

## 2024-01-03 MED ORDER — HYDROCODONE BIT-HOMATROP MBR 5-1.5 MG/5ML PO SOLN: 5.0000 mL | Freq: Four times a day (QID) | ORAL | 0 refills | Status: AC | PRN

## 2024-01-03 NOTE — Discharge Instructions (Addendum)
 Advised patient to take Zithromax with first dose of Augmentin until complete.  Advised may take Tessalon capsules daily or as needed for cough.  Advised may take Hycodan cough syrup at night prior to sleep for cough due to sedative effects.  Advised patient may take OTC Tylenol 1 g every 6 hours for fever (oral temperature greater than 100.3).  Encouraged to increase daily water intake to 64 ounces per day while taking this medication.  Advised patient to repeat chest x-ray on or about 01/29/2024 to ensure pneumonia has resolved.  Advised if symptoms worsen and/or unresolved please follow-up with your PCP or here for further evaluation.

## 2024-01-03 NOTE — ED Provider Notes (Signed)
 Tommy Strickland CARE    CSN: [id number] Arrival date & time: 01/03/24  1842      History   Chief Complaint Chief Complaint  Patient presents with   Fever    HPI Tommy Strickland is a 63 y.o. male.   HPI 63 year old male presents with fever for 3 days as high as 104.3.  Patient reports going to ER recently and diagnosed with pneumonia started on Augmentin on Saturday.  COVID influenza testing were negative.  PMH significant for obesity, BCC, and lumbar degenerative disc disease.  Patient is accompanied by his wife this evening.  History reviewed. No pertinent past medical history.  Patient Active Problem List   Diagnosis Date Noted   Hyperlipidemia LDL goal <100 05/08/2019   Polyp of colon 01/23/2019   Skin cancer, basal cell 01/18/2018   Class 3 severe obesity due to excess calories without serious comorbidity with body mass index (BMI) of 40.0 to 44.9 in adult (HCC) 01/18/2018   Actinic keratoses 01/18/2018   Lumbar degenerative disc disease 01/03/2018   Abnormal weight gain 01/03/2018    Past Surgical History:  Procedure Laterality Date   ACHILLES TENDON REPAIR     SMALL INTESTINE SURGERY         Home Medications    Prior to Admission medications   Medication Sig Start Date End Date Taking? Authorizing Provider  amoxicillin-clavulanate (AUGMENTIN) 875-125 MG tablet Take 1 tablet by mouth 2 (two) times daily. 01/02/24 01/12/24 Yes [provider]  azithromycin (ZITHROMAX) 250 MG tablet Take 1 tablet (250 mg total) by mouth daily. Take first 2 tablets together, then 1 every day until finished. 01/03/24  Yes Trevor Iha, FNP  benzonatate (TESSALON) 200 MG capsule Take 1 capsule (200 mg total) by mouth 3 (three) times daily as needed for up to 7 days. 01/03/24 01/10/24 Yes Trevor Iha, FNP  HYDROcodone bit-homatropine (HYCODAN) 5-1.5 MG/5ML syrup Take 5 mLs by mouth every 6 (six) hours as needed for cough. 01/03/24  Yes Trevor Iha, FNP  phentermine  (ADIPEX-P) 37.5 MG tablet One tab by mouth qAM 06/11/21   Jomarie Longs, PA-C    Family History Family History  Problem Relation Age of Onset   Stroke Paternal Grandfather     Social History Social History   Tobacco Use   Smoking status: Never   Smokeless tobacco: Never  Vaping Use   Vaping status: Never Used  Substance Use Topics   Alcohol use: No    Alcohol/week: 1.0 standard drink of alcohol    Types: 1 Cans of beer per week   Drug use: No     Allergies   Patient has no known allergies.   Review of Systems Review of Systems  Constitutional:  Positive for fever.  Respiratory:  Positive for cough.   All other systems reviewed and are negative.    Physical Exam Triage Vital Signs ED Triage Vitals  Encounter Vitals Group     BP 01/03/24 1900 121/76     Systolic BP Percentile --      Diastolic BP Percentile --      Pulse Rate 01/03/24 1900 91     Resp 01/03/24 1900 18     Temp 01/03/24 1900 99 F (37.2 C)     Temp Source 01/03/24 1900 Oral     SpO2 01/03/24 1900 92 %     Weight --      Height --      Head Circumference --      Peak  Flow --      Pain Score 01/03/24 1902 6     Pain Loc --      Pain Education --      Exclude from Growth Chart --    No data found.  Updated Vital Signs BP 121/76 (BP Location: Right Arm)   Pulse 91   Temp 99 F (37.2 C) (Oral)   Resp 18   SpO2 92%    Physical Exam Vitals and nursing note reviewed.  Constitutional:      General: He is not in acute distress.    Appearance: Normal appearance. He is obese. He is ill-appearing.  HENT:     Head: Normocephalic and atraumatic.     Right Ear: Tympanic membrane, ear canal and external ear normal.     Left Ear: Tympanic membrane, ear canal and external ear normal.     Nose: Nose normal.     Mouth/Throat:     Mouth: Mucous membranes are moist.     Pharynx: Oropharynx is clear.  Eyes:     Extraocular Movements: Extraocular movements intact.     Conjunctiva/sclera:  Conjunctivae normal.     Pupils: Pupils are equal, round, and reactive to light.  Cardiovascular:     Rate and Rhythm: Normal rate and regular rhythm.     Pulses: Normal pulses.     Heart sounds: Normal heart sounds.  Pulmonary:     Effort: Pulmonary effort is normal.     Breath sounds: No wheezing, rhonchi or rales.     Comments: Diminished breath sounds noted throughout, infrequent nonproductive cough on exam Musculoskeletal:        General: Normal range of motion.     Cervical back: Normal range of motion and neck supple.  Skin:    General: Skin is warm and dry.  Neurological:     General: No focal deficit present.     Mental Status: He is alert and oriented to person, place, and time. Mental status is at baseline.  Psychiatric:        Mood and Affect: Mood normal.        Behavior: Behavior normal.        Thought Content: Thought content normal.      UC Treatments / Results  Labs (all labs ordered are listed, but only abnormal results are displayed) Labs Reviewed - No data to display  EKG   Radiology No results found.  Procedures Procedures (including critical care time)  Medications Ordered in UC Medications - No data to display  Initial Impression / Assessment and Plan / UC Course  I have reviewed the triage vital signs and the nursing notes.  Pertinent labs & imaging results that were available during my care of the patient were reviewed by me and considered in my medical decision making (see chart for details).     MDM: 1.  Cough, unspecified type-Rx'd Zithromax (500 mg day 1, then 250 mg day 2-5), Rx'd Tessalon 200 mg capsules: Take 1 capsule 3 times daily, as needed for cough, Rx'd Hycodan 5-1.5 mg/5 mL syrup take 5 mL every 6 hours, as needed for cough. Advised patient to take Zithromax with first dose of Augmentin until complete.  Advised may take Tessalon capsules daily or as needed for cough.  Advised may take Hycodan cough syrup at night prior to sleep  for cough due to sedative effects.  2.  Fever, unspecified-advised patient may take OTC Tylenol 1 g every 6 hours for fever (oral temperature  greater than 100.3).  Encouraged to increase daily water intake to 64 ounces per day while taking this medication.  Advised patient to repeat chest x-ray on or about 01/29/2024 to ensure pneumonia has resolved.  Advised if symptoms worsen and/or unresolved please follow-up with your PCP or here for further evaluation.  Patient discharged home, hemodynamically stable. Final Clinical Impressions(s) / UC Diagnoses   Final diagnoses:  Fever, unspecified  Cough, unspecified type     Discharge Instructions      Advised patient to take Zithromax with first dose of Augmentin until complete.  Advised may take Tessalon capsules daily or as needed for cough.  Advised may take Hycodan cough syrup at night prior to sleep for cough due to sedative effects.  Advised patient may take OTC Tylenol 1 g every 6 hours for fever (oral temperature greater than 100.3).  Encouraged to increase daily water intake to 64 ounces per day while taking this medication.  Advised patient to repeat chest x-ray on or about 01/29/2024 to ensure pneumonia has resolved.  Advised if symptoms worsen and/or unresolved please follow-up with your PCP or here for further evaluation.     ED Prescriptions     Medication Sig Dispense Auth. Provider   azithromycin (ZITHROMAX) 250 MG tablet Take 1 tablet (250 mg total) by mouth daily. Take first 2 tablets together, then 1 every day until finished. 6 tablet Trevor Iha, FNP   benzonatate (TESSALON) 200 MG capsule Take 1 capsule (200 mg total) by mouth 3 (three) times daily as needed for up to 7 days. 40 capsule Trevor Iha, FNP   HYDROcodone bit-homatropine (HYCODAN) 5-1.5 MG/5ML syrup Take 5 mLs by mouth every 6 (six) hours as needed for cough. 120 mL Trevor Iha, FNP      I have reviewed the PDMP during this encounter.   Trevor Iha,  FNP 01/03/24 1926

## 2024-01-03 NOTE — ED Triage Notes (Signed)
 Patient has had fever x 3 days as high as 104.3.  Patient did go to ER and diagnosed w/pneumonia and started Augmentin 875-125mg  on Saturday.  Covid and Influenza testing were negative.

## 2024-01-26 ENCOUNTER — Ambulatory Visit

## 2024-01-26 ENCOUNTER — Ambulatory Visit: Admitting: Sports Medicine

## 2024-01-26 ENCOUNTER — Other Ambulatory Visit (INDEPENDENT_AMBULATORY_CARE_PROVIDER_SITE_OTHER): Payer: Self-pay

## 2024-01-26 DIAGNOSIS — S83207A Unspecified tear of unspecified meniscus, current injury, left knee, initial encounter: Secondary | ICD-10-CM | POA: Diagnosis not present

## 2024-01-26 DIAGNOSIS — M1712 Unilateral primary osteoarthritis, left knee: Secondary | ICD-10-CM

## 2024-01-26 DIAGNOSIS — J189 Pneumonia, unspecified organism: Secondary | ICD-10-CM

## 2024-01-26 DIAGNOSIS — R918 Other nonspecific abnormal finding of lung field: Secondary | ICD-10-CM

## 2024-01-26 DIAGNOSIS — M1711 Unilateral primary osteoarthritis, right knee: Secondary | ICD-10-CM | POA: Diagnosis not present

## 2024-01-26 MED ORDER — TRIAMCINOLONE ACETONIDE 40 MG/ML IJ SUSP
40.0000 mg | Freq: Once | INTRAMUSCULAR | Status: AC
Start: 2024-01-26 — End: 2024-01-26
  Administered 2024-01-26: 40 mg via INTRAMUSCULAR

## 2024-01-26 NOTE — Progress Notes (Signed)
    Procedures performed today:    Procedure: Real-time Ultrasound Guided injection of the left knee Device: Samsung HS60  Verbal informed consent obtained.  Time-out conducted.  Noted no overlying erythema, induration, or other signs of local infection.  Skin prepped in a sterile fashion.  Local anesthesia: Topical Ethyl chloride.  With sterile technique and under real time ultrasound guidance: Mild to moderate effusion noted, 1 cc Kenalog 40, 2 cc lidocaine, 2 cc bupivacaine injected easily Completed without difficulty  Advised to call if fevers/chills, erythema, induration, drainage, or persistent bleeding.  Images permanently stored and available for review in PACS.  Impression: Technically successful ultrasound guided injection.  Independent interpretation of notes and tests performed by another provider:   None.  Brief History, Exam, Impression, and Recommendations:    Acute meniscal tear of left knee This is a very pleasant 63 year old male, he works on a computer all day, he has had increasing pain that he localizes left knee lateral aspect radiation up the thigh and down the lower leg. He recalls talking to his primary care provider who ordered a DVT ultrasound, this was negative. Continues to have discomfort, he is now walking with a cane. On exam he has visible and palpable swelling with a very mild effusion, he has tenderness at the lateral joint line, he has about 5 degrees of extension lag in about 30 degrees of flexion lag with severe pain, positive McMurray's sign with pain. I do suspect he has a degenerative meniscal tear, adding x-rays, MRI due to the positive McMurray's sign with mechanical symptoms, we injected his knee today for immediate relief, return to see me for MRI results. Home physical therapy given.  Right upper lobe pneumonia On further review of [place] chart he did have a pneumonia about a month ago, he was treated, x-rays at the time did show a rounded  opacity right upper lobe. Considering the rounded nature of this opacity I do think he needs a follow-up x-ray to ensure resolution.    ____________________________________________ Tommy Strickland. Benjamin Stain, M.D., ABFM., CAQSM., AME. Primary Care and Sports Medicine [facility]  Adjunct Professor of Family Medicine  [place] [employer]  Restaurant manager, fast food

## 2024-01-26 NOTE — Assessment & Plan Note (Signed)
 On further review of [place] chart he did have a pneumonia about a month ago, he was treated, x-rays at the time did show a rounded opacity right upper lobe. Considering the rounded nature of this opacity I do think he needs a follow-up x-ray to ensure resolution.

## 2024-01-26 NOTE — Assessment & Plan Note (Signed)
 This is a very pleasant 63 year old male, he works on a computer all day, he has had increasing pain that he localizes left knee lateral aspect radiation up the thigh and down the lower leg. He recalls talking to his primary care provider who ordered a DVT ultrasound, this was negative. Continues to have discomfort, he is now walking with a cane. On exam he has visible and palpable swelling with a very mild effusion, he has tenderness at the lateral joint line, he has about 5 degrees of extension lag in about 30 degrees of flexion lag with severe pain, positive McMurray's sign with pain. I do suspect he has a degenerative meniscal tear, adding x-rays, MRI due to the positive McMurray's sign with mechanical symptoms, we injected his knee today for immediate relief, return to see me for MRI results. Home physical therapy given.

## 2024-02-01 ENCOUNTER — Encounter: Payer: Self-pay | Admitting: Sports Medicine

## 2024-02-01 NOTE — Telephone Encounter (Signed)
 Please let him know the official results are not back, we are still waiting, okay to move the x-rays to the front of the list however on my own personal interpretation I do not see any persistent rounded opacity in the right upper lobe like was seen prior.  It seems to have resolved.

## 2024-02-06 ENCOUNTER — Ambulatory Visit

## 2024-02-06 DIAGNOSIS — S83207A Unspecified tear of unspecified meniscus, current injury, left knee, initial encounter: Secondary | ICD-10-CM | POA: Diagnosis not present

## 2024-02-06 DIAGNOSIS — S83232A Complex tear of medial meniscus, current injury, left knee, initial encounter: Secondary | ICD-10-CM | POA: Diagnosis not present

## 2024-02-06 DIAGNOSIS — M2242 Chondromalacia patellae, left knee: Secondary | ICD-10-CM | POA: Diagnosis not present

## 2024-02-06 DIAGNOSIS — S8332XA Tear of articular cartilage of left knee, current, initial encounter: Secondary | ICD-10-CM | POA: Diagnosis not present

## 2024-02-06 DIAGNOSIS — M25462 Effusion, left knee: Secondary | ICD-10-CM | POA: Diagnosis not present

## 2024-02-06 DIAGNOSIS — M23222 Derangement of posterior horn of medial meniscus due to old tear or injury, left knee: Secondary | ICD-10-CM

## 2024-02-15 ENCOUNTER — Encounter: Payer: Self-pay | Admitting: Sports Medicine

## 2024-02-15 NOTE — Telephone Encounter (Signed)
 Imaging not resulted - would you like me to contact radiology reading room and have it changed to STAT?

## 2024-02-16 NOTE — Telephone Encounter (Signed)
 Spoke with radiology reading room . Order changed to STAT read

## 2024-03-08 ENCOUNTER — Other Ambulatory Visit (INDEPENDENT_AMBULATORY_CARE_PROVIDER_SITE_OTHER)

## 2024-03-08 ENCOUNTER — Ambulatory Visit: Admitting: Sports Medicine

## 2024-03-08 ENCOUNTER — Encounter: Payer: Self-pay | Admitting: Sports Medicine

## 2024-03-08 DIAGNOSIS — S83207D Unspecified tear of unspecified meniscus, current injury, left knee, subsequent encounter: Secondary | ICD-10-CM

## 2024-03-08 NOTE — Progress Notes (Signed)
    Procedures performed today:    Procedure: Real-time Ultrasound Guided aspiration of left knee Device: Samsung HS60  Verbal informed consent obtained.  Time-out conducted.  Noted no overlying erythema, induration, or other signs of local infection.  Skin prepped in a sterile fashion.  Local anesthesia: Topical Ethyl chloride.  With sterile technique and under real time ultrasound guidance: Noted effusion, using an 18-gauge needle aspirated 60 mL of clear, straw-colored fluid. Completed without difficulty  Advised to call if fevers/chills, erythema, induration, drainage, or persistent bleeding.  Images permanently stored and available for review in PACS.  Impression: Technically successful ultrasound guided therapeutic arthrocentesis.  Independent interpretation of notes and tests performed by another provider:   None.  Brief History, Exam, Impression, and Recommendations:    Acute meniscal tear of left knee Pleasant 63 year old male, increasing knee pain, we did an injection, obtained an MRI due to mechanical symptoms, MRI did confirm occasional spots of full-thickness cartilage loss and medial meniscal tearing. He did a lot better after the injection, he is doing home physical therapy. He does have a tense effusion today so we will drain this, he will continue home conditioning and return to see me 2 months as needed. If he does not improve and is not having mechanical symptoms we will proceed with viscosupplementation if he is having significant mechanical symptoms we will send him for arthroscopy.    ____________________________________________ Joselyn Nicely. Sandy Crumb, M.D., ABFM., CAQSM., AME. Primary Care and Sports Medicine [facility]  Adjunct Professor of [facility] Medicine  [employer]  Restaurant manager, fast food

## 2024-03-08 NOTE — Assessment & Plan Note (Signed)
 Pleasant 63 year old male, increasing knee pain, we did an injection, obtained an MRI due to mechanical symptoms, MRI did confirm occasional spots of full-thickness cartilage loss and medial meniscal tearing. He did a lot better after the injection, he is doing home physical therapy. He does have a tense effusion today so we will drain this, he will continue home conditioning and return to see me 2 months as needed. If he does not improve and is not having mechanical symptoms we will proceed with viscosupplementation if he is having significant mechanical symptoms we will send him for arthroscopy.

## 2024-05-08 ENCOUNTER — Ambulatory Visit: Admitting: Sports Medicine

## 2024-06-05 ENCOUNTER — Ambulatory Visit: Admitting: Sports Medicine

## 2024-07-18 ENCOUNTER — Encounter: Payer: Self-pay | Admitting: Sports Medicine

## 2024-11-10 ENCOUNTER — Ambulatory Visit: Payer: Self-pay

## 2024-11-10 NOTE — Telephone Encounter (Signed)
 Sending to the provider as an RICK - An appointment was offered for further evaluation; patient declined stating he has established care with another PCP outside of [facility]. He will call there office now, and if he cannot be seen by them today, he stated he will go to UC.

## 2024-11-10 NOTE — Telephone Encounter (Signed)
 FYI Only or Action Required?: FYI only for provider: See note below.  Patient was last seen in primary care on 03/08/2024 by Curtis Debby PARAS, MD.  Called Nurse Triage reporting Back Pain.  Symptoms began yesterday.  Interventions attempted: Other: Back brace.  Symptoms are: unchanged.  Triage Disposition: See PCP When Office is Open (Within 3 Days)  Patient/caregiver understands and will follow disposition?: Yes          Copied from CRM #[id number]. Topic: Clinical - Red Word Triage >> Nov 10, 2024  9:30 AM Victoria B wrote: [facility] that prompted transfer to Nurse Triage: Patient has severe back pain at a 7 Reason for Disposition  [1] MODERATE back pain (e.g., interferes with normal activities) AND [2] present > 3 days  Answer Assessment - Initial Assessment Questions 1. ONSET: When did the pain begin? (e.g., minutes, hours, days)     X 1 day    2. LOCATION: Where does it hurt? (upper, mid or lower back)  Lower back        3. SEVERITY: How bad is the pain?  (e.g., Scale 1-10; mild, moderate, or severe)     6-7/10   4. PATTERN: Is the pain constant? (e.g., yes, no; constant, intermittent)       Constant    5. RADIATION: Does the pain shoot into your legs or somewhere else?         6. CAUSE:  What do you think is causing the back pain?      Doing yard work a couple of days ago   7. BACK OVERUSE:  Any recent lifting of heavy objects, strenuous work or exercise?     Yes   8. MEDICINES: What have you taken so far for the pain? (e.g., nothing, acetaminophen , NSAIDS)     No    9. NEUROLOGIC SYMPTOMS: Do you have any weakness, numbness, or problems with bowel/bladder control?     No    10. OTHER SYMPTOMS: Do you have any other symptoms? (e.g., fever, abdomen pain, burning with urination, blood in urine)       No     Patient called in to triage with complaints of lower back pain. This has been ongoing since yesterday, after  doing yard work a couple of days ago. The patient stated he has degenerative disk disease.  For home care, the patient is using a back brace.   Appointment offered for further evaluation; patient declined stating he has established care with another PCP outside of [facility], He will call there office now, and if he cannot be seen by them today, he stated he will go to UC.  Protocols used: Back Pain-A-AH
